# Patient Record
Sex: Male | Born: 1980 | Race: White | Hispanic: No | Marital: Married | Smoking: Never smoker
Health system: Southern US, Community
[De-identification: ages and names within clinical notes are randomized; demographics above are authoritative.]

## PROBLEM LIST (undated history)

## (undated) DIAGNOSIS — G43909 Migraine, unspecified, not intractable, without status migrainosus: Secondary | ICD-10-CM

## (undated) DIAGNOSIS — R5383 Other fatigue: Secondary | ICD-10-CM

## (undated) DIAGNOSIS — Z789 Other specified health status: Secondary | ICD-10-CM

## (undated) HISTORY — DX: Migraine, unspecified, not intractable, without status migrainosus: G43.909

## (undated) HISTORY — DX: Other fatigue: R53.83

## (undated) HISTORY — DX: Other specified health status: Z78.9

---

## 2016-12-07 ENCOUNTER — Encounter: Payer: Self-pay | Admitting: *Deleted

## 2016-12-08 ENCOUNTER — Other Ambulatory Visit
Admission: RE | Admit: 2016-12-08 | Discharge: 2016-12-08 | Disposition: A | Discharge status: Home / Self Care | Payer: BLUE CROSS/BLUE SHIELD | Source: Ambulatory Visit | Attending: General Surgery | Admitting: General Surgery

## 2016-12-08 ENCOUNTER — Encounter: Payer: Self-pay | Admitting: General Surgery

## 2016-12-08 ENCOUNTER — Ambulatory Visit (INDEPENDENT_AMBULATORY_CARE_PROVIDER_SITE_OTHER): Payer: BLUE CROSS/BLUE SHIELD | Admitting: General Surgery

## 2016-12-08 ENCOUNTER — Ambulatory Visit
Admission: RE | Admit: 2016-12-08 | Discharge: 2016-12-08 | Disposition: A | Discharge status: Home / Self Care | Payer: BLUE CROSS/BLUE SHIELD | Source: Ambulatory Visit | Attending: General Surgery | Admitting: General Surgery

## 2016-12-08 VITALS — BP 138/78 | HR 66 | Temp 97.9°F | Resp 12 | Ht 70.0 in | Wt 179.0 lb

## 2016-12-08 DIAGNOSIS — R1031 Right lower quadrant pain: Secondary | ICD-10-CM

## 2016-12-08 LAB — CBC WITH DIFFERENTIAL/PLATELET
BASOS ABS: 0.1 10*3/uL (ref 0–0.1)
Basophils Relative: 1 %
Eosinophils Absolute: 0 10*3/uL (ref 0–0.7)
Eosinophils Relative: 1 %
HEMATOCRIT: 46.7 % (ref 40.0–52.0)
Hemoglobin: 16 g/dL (ref 13.0–18.0)
LYMPHS PCT: 31 %
Lymphs Abs: 1.3 10*3/uL (ref 1.0–3.6)
MCH: 30.4 pg (ref 26.0–34.0)
MCHC: 34.4 g/dL (ref 32.0–36.0)
MCV: 88.3 fL (ref 80.0–100.0)
MONO ABS: 0.2 10*3/uL (ref 0.2–1.0)
Monocytes Relative: 6 %
NEUTROS ABS: 2.6 10*3/uL (ref 1.4–6.5)
Neutrophils Relative %: 61 %
Platelets: 233 10*3/uL (ref 150–440)
RBC: 5.28 MIL/uL (ref 4.40–5.90)
RDW: 13 % (ref 11.5–14.5)
WBC: 4.2 10*3/uL (ref 3.8–10.6)

## 2016-12-08 MED ORDER — IOPAMIDOL (ISOVUE-300) INJECTION 61%
100.0000 mL | Freq: Once | INTRAVENOUS | Status: AC | PRN
  Administered 2016-12-08: 100 mL via INTRAVENOUS

## 2016-12-08 NOTE — Patient Instructions (Addendum)
Repeat CT scan to see if anything has changed with ileum or appendix, and assess whether antibiotics are appropriate.  Return to office after CT completed.  No acute or abnormal findings on CT. CBC was normal. No further treatment or evaluation indicated at this time. Advised patient to call if symptoms worsen.

## 2016-12-08 NOTE — Progress Notes (Signed)
Patient ID: [name] [name], male   DOB: [birth year], 36 y.o.   MRN: [id number]  Chief Complaint  Patient presents with  . Abdominal Pain    HPI [name] [name] is a 36 y.o. male.  Here today for evaluation of lower right abdomen pain radiating to the center of abdomen. The pain started Friday, associated with nausea and headaches but no vomiting. He did go to the ED at [facility]. CT scan was done. He has never had this type of pain in the past. Reports normal bowel function before this started.  States he had fever but didn't check it.   HPI  Past Medical History:  Diagnosis Date  . Patient denies medical problems     History reviewed. No pertinent surgical history.  Family History  Problem Relation Age of Onset  . Breast cancer Mother     Social History Social History  Substance Use Topics  . Smoking status: Never Smoker  . Smokeless tobacco: Never Used  . Alcohol use Yes    No Known Allergies  Current Outpatient Prescriptions  Medication Sig Dispense Refill  . ondansetron (ZOFRAN-ODT) 4 MG disintegrating tablet Take 4 mg by mouth.     No current facility-administered medications for this visit.     Review of Systems Review of Systems  Constitutional: Positive for fever.  Respiratory: Negative.   Cardiovascular: Negative.   Gastrointestinal: Positive for abdominal pain and nausea. Negative for vomiting.  Neurological: Positive for headaches.    Blood pressure 138/78, pulse 66, temperature 97.9 F (36.6 C), temperature source Oral, resp. rate 12, height  (1.778 m), weight 179 lb (81.2 kg).  Physical Exam Physical Exam  Constitutional: He is oriented to person, place, and time. He appears well-developed and well-nourished.  Eyes: Conjunctivae are normal. No scleral icterus.  Neck: Neck supple.  Cardiovascular: Normal rate, regular rhythm and normal heart sounds.   Pulmonary/Chest: Effort normal and breath sounds normal.  Abdominal: Soft. Bowel sounds are normal.  There is tenderness (right lower quadrant, mild, no rebound).  Lymphadenopathy:    He has no cervical adenopathy.  Neurological: He is alert and oriented to person, place, and time.  Skin: Skin is warm and dry.  Psychiatric: His behavior is normal.    Data Reviewed CT from 3 days ago reviewed- mild mucosal edema of terminal ileum noted Repeat CT today is entirely normal. WBC is normal. Assessment    RLQ abd pain which is better today. No apparent cause.     Plan        No further treatment or evaluation indicated at this time. Advised patient to call if symptoms worsen or recur.      HPI, Physical Exam, Assessment and Plan have been scribed under the direction and in the presence of Kathreen Cosier, MD  Dorathy Daft, RN  I have completed the exam and reviewed the above documentation for accuracy and completeness.  I agree with the above.  Museum/gallery conservator has been used and any errors in dictation or transcription are unintentional.  Ajia Chadderdon G. Evette Cristal, M.D., F.A.C.S.  Gerlene Burdock G 12/09/2016, 9:29 AM

## 2017-06-02 ENCOUNTER — Emergency Department
Admission: EM | Admit: 2017-06-02 | Discharge: 2017-06-02 | Disposition: A | Discharge status: Home / Self Care | Payer: BLUE CROSS/BLUE SHIELD | Attending: Emergency Medicine | Admitting: Emergency Medicine

## 2017-06-02 ENCOUNTER — Encounter: Payer: Self-pay | Admitting: Emergency Medicine

## 2017-06-02 ENCOUNTER — Emergency Department: Payer: BLUE CROSS/BLUE SHIELD

## 2017-06-02 DIAGNOSIS — Y929 Unspecified place or not applicable: Secondary | ICD-10-CM | POA: Insufficient documentation

## 2017-06-02 DIAGNOSIS — Y990 Civilian activity done for income or pay: Secondary | ICD-10-CM | POA: Diagnosis not present

## 2017-06-02 DIAGNOSIS — W319XXA Contact with unspecified machinery, initial encounter: Secondary | ICD-10-CM | POA: Diagnosis not present

## 2017-06-02 DIAGNOSIS — Y939 Activity, unspecified: Secondary | ICD-10-CM | POA: Insufficient documentation

## 2017-06-02 DIAGNOSIS — S61412A Laceration without foreign body of left hand, initial encounter: Secondary | ICD-10-CM | POA: Diagnosis not present

## 2017-06-02 DIAGNOSIS — S6992XA Unspecified injury of left wrist, hand and finger(s), initial encounter: Secondary | ICD-10-CM | POA: Diagnosis present

## 2017-06-02 MED ORDER — MELOXICAM 15 MG PO TABS: 15.0000 mg | ORAL_TABLET | Freq: Every day | ORAL | 1 refills | Status: AC

## 2017-06-02 MED ORDER — SULFAMETHOXAZOLE-TRIMETHOPRIM 800-160 MG PO TABS: 1.0000 | ORAL_TABLET | Freq: Two times a day (BID) | ORAL | 0 refills | Status: AC

## 2017-06-02 MED ORDER — OXYCODONE-ACETAMINOPHEN 5-325 MG PO TABS: 1.0000 | ORAL_TABLET | Freq: Four times a day (QID) | ORAL | 0 refills | Status: AC | PRN

## 2017-06-02 MED ORDER — OXYCODONE-ACETAMINOPHEN 5-325 MG PO TABS
1.0000 | ORAL_TABLET | Freq: Once | ORAL | Status: AC
  Administered 2017-06-02: 1 via ORAL
  Filled 2017-06-02: qty 1

## 2017-06-02 MED ORDER — LIDOCAINE HCL (PF) 1 % IJ SOLN
INTRAMUSCULAR | Status: AC
Start: 2017-06-02 — End: 2017-06-02
  Administered 2017-06-02: 5 mL
  Filled 2017-06-02: qty 10

## 2017-06-02 MED ORDER — LIDOCAINE HCL 1 % IJ SOLN
5.0000 mL | Freq: Once | INTRAMUSCULAR | Status: AC
  Administered 2017-06-02: 5 mL
  Filled 2017-06-02: qty 5

## 2017-06-02 NOTE — ED Provider Notes (Signed)
[facility] Emergency Department Provider Note  ____________________________________________  Time seen: Approximately 4:55 PM  I have reviewed the triage vital signs and the nursing notes.   HISTORY  Chief Complaint Laceration    HPI [name] [name] is a 36 y.o. male presents to the emergency department with a 3 cm in length by 2 cm in width laceration sustained between the second and third digits of the left hand with a machine at work.  Patient reports no radiculopathy or changes in sensation of the left hand.  Patient has been able to actively flex and extend the second and third digits.  Tetanus status is up-to-date.  No alleviating measures have been attempted.   Past Medical History:  Diagnosis Date  . Patient denies medical problems     There are no active problems to display for this patient.   History reviewed. No pertinent surgical history.  Prior to Admission medications   Medication Sig Start Date End Date Taking? Authorizing Provider  meloxicam (MOBIC) 15 MG tablet Take 1 tablet (15 mg total) by mouth daily. 06/02/17 06/16/17  Orvil Feil, PA-C  oxyCODONE-acetaminophen (ROXICET) 5-325 MG tablet Take 1 tablet by mouth every 6 (six) hours as needed for severe pain. 06/02/17 06/07/17  Orvil Feil, PA-C  sulfamethoxazole-trimethoprim (BACTRIM DS,SEPTRA DS) 800-160 MG tablet Take 1 tablet by mouth 2 (two) times daily. 06/02/17 06/09/17  Orvil Feil, PA-C    Allergies Patient has no known allergies.  Family History  Problem Relation Age of Onset  . Breast cancer Mother     Social History Social History  Substance Use Topics  . Smoking status: Never Smoker  . Smokeless tobacco: Never Used  . Alcohol use Yes     Review of Systems  Constitutional: No fever/chills Eyes: No visual changes. No discharge ENT: No upper respiratory complaints. Cardiovascular: no chest pain. Respiratory: no cough. No SOB. Musculoskeletal:  Negative for musculoskeletal pain. Skin: Patient has a laceration Neurological: Negative for headaches, focal weakness or numbness.  ____________________________________________   PHYSICAL EXAM:  VITAL SIGNS: ED Triage Vitals [06/02/17 1645]  Enc Vitals Group     BP 130/74     Pulse Rate 73     Resp 18     Temp      Temp Source Oral     SpO2 100 %     Weight 180 lb (81.6 kg)     Height 5\' 10"  (1.778 m)     Head Circumference      Peak Flow      Pain Score 7     Pain Loc      Pain Edu?      Excl. in GC?      Constitutional: Alert and oriented. Well appearing and in no acute distress. Eyes: Conjunctivae are normal. PERRL. EOMI. Head: Atraumatic. Cardiovascular: Normal rate, regular rhythm. Normal S1 and S2.  Good peripheral circulation. Respiratory: Normal respiratory effort without tachypnea or retractions. Lungs CTAB. Good air entry to the bases with no decreased or absent breath sounds. Musculoskeletal: Patient is able to perform resisted flexion and extension at the second and third digits.  Palpable radial pulse, right. Neurologic:  Normal speech and language. No gross focal neurologic deficits are appreciated.  Skin: Has a 3 cm in length by 2 cm in width laceration sustained between the second and third digits at the dorsal aspect of the left hand. Psychiatric: Mood and affect are normal. Speech and behavior are normal. Patient exhibits appropriate  insight and judgement.   ____________________________________________   LABS (all labs ordered are listed, but only abnormal results are displayed)  Labs Reviewed - No data to display ____________________________________________  EKG   ____________________________________________  RADIOLOGY Geraldo Pitter, personally viewed and evaluated these images (plain radiographs) as part of my medical decision making, as well as reviewing the written report by the radiologist.    Dg Hand Complete Left  Result Date:  06/02/2017 CLINICAL DATA:  Left hand laceration EXAM: LEFT HAND - COMPLETE 3+ VIEW COMPARISON:  None. FINDINGS: Soft tissue laceration between the second and third digits. Slightly comminuted cortical fracture involving the base of the second proximal phalanx with articular involvement. Multiple indistinct bone fragments within the soft tissue web between the second and third digits. IMPRESSION: Soft tissue laceration between the second and third digits with evidence for comminuted fracture at the base of the second proximal phalanx with multiple displaced bone fragments. Electronically Signed   By: Jasmine Pang M.D.   On: 06/02/2017 17:31    ____________________________________________    PROCEDURES  Procedure(s) performed:    Procedures  LACERATION REPAIR Performed by: Orvil Feil Authorized by: Orvil Feil Consent: Verbal consent obtained. Risks and benefits: risks, benefits and alternatives were discussed Consent given by: patient Patient identity confirmed: provided demographic data Prepped and Draped in normal sterile fashion Wound explored  Laceration Location: Left hand   Laceration Length: 3 cm in length by 2 cm in width.   No Foreign Bodies seen or palpated  Anesthesia: local infiltration  Local anesthetic: lidocaine 1% without epinephrine  Anesthetic total: 10 ml  Irrigation method: syringe Amount of cleaning: standard  Skin closure: 4-0 Ethilon   Number of sutures: 10  Technique: Simple Interrupted.   Patient tolerance: Patient tolerated the procedure well with no immediate complications.   Medications  oxyCODONE-acetaminophen (PERCOCET/ROXICET) 5-325 MG per tablet 1 tablet (1 tablet Oral Given 06/02/17 1708)  lidocaine (XYLOCAINE) 1 % (with pres) injection 5 mL (5 mLs Infiltration Given by Other 06/02/17 1837)  lidocaine (XYLOCAINE) 1 % (with pres) injection 5 mL (5 mLs Infiltration Given by Other 06/02/17 1836)      ____________________________________________   INITIAL IMPRESSION / ASSESSMENT AND PLAN / ED COURSE  Pertinent labs & imaging results that were available during my care of the patient were reviewed by me and considered in my medical decision making (see chart for details).  Review of the [place] CSRS was performed in accordance of the [facility] prior to dispensing any controlled drugs.    Assessment and plan Left hand laceration Patient presents to the emergency department with a 3 cm in length by 2 cm in width left hand laceration.  Differential diagnosis includes tensor tendon disruption versus extensor tendon laceration versus superficial laceration versus fracture.  X-ray examination conducted in the emergency department was concerning for comminuted fracture at the base of the second phalanx of the left hand.  Patient underwent laceration repair in the emergency department without complication.  There was no deficits in extensor tendon testing, decreasing suspicion for extensor tendon disruption or laceration.  It was buddy taped and splinted after laceration repair.  He was advised to seek care and follow-up with Dr. Stephenie Acres.  Due to complicated nature of laceration and concern for open fracture, patient was discharged with Bactrim for MRSA coverage.  Patient was advised to have sutures removed by primary care in 10 days.  Work note was given.    ____________________________________________  FINAL CLINICAL IMPRESSION(S) /  ED DIAGNOSES  Final diagnoses:  Laceration of left hand without foreign body, initial encounter      NEW MEDICATIONS STARTED DURING THIS VISIT:  New Prescriptions   MELOXICAM (MOBIC) 15 MG TABLET    Take 1 tablet (15 mg total) by mouth daily.   OXYCODONE-ACETAMINOPHEN (ROXICET) 5-325 MG TABLET    Take 1 tablet by mouth every 6 (six) hours as needed for severe pain.   SULFAMETHOXAZOLE-TRIMETHOPRIM (BACTRIM DS,SEPTRA DS) 800-160 MG TABLET    Take 1 tablet by mouth 2  (two) times daily.        This chart was dictated using voice recognition software/Dragon. Despite best efforts to proofread, errors can occur which can change the meaning. Any change was purely unintentional.    Orvil FeilWoods, Taitum Alms M, PA-C 06/02/17 1843    Phineas SemenGoodman, Graydon, MD 06/02/17 [phone]

## 2017-06-02 NOTE — ED Notes (Signed)
Per pt immediate supervisor pt is not required to complete WC drug screening or breath analysis Diona FoleyBrandon Griggs (surpervisor)

## 2017-06-02 NOTE — ED Notes (Signed)
First Nurse: pt presents with left hand injury with grinding machine. PTA, WC.

## 2017-06-02 NOTE — ED Triage Notes (Signed)
Pt comes into the ED via POv c/o workers comp laceration to the left hand.  Bleeding controlled at this time.  Patient ambulatory to triage with no difficulties noted. Patient states he is up to date on his tetanus.

## 2017-06-06 DIAGNOSIS — S62609A Fracture of unspecified phalanx of unspecified finger, initial encounter for closed fracture: Secondary | ICD-10-CM | POA: Insufficient documentation

## 2017-06-07 ENCOUNTER — Ambulatory Visit
Admission: RE | Admit: 2017-06-07 | Discharge: 2017-06-07 | Disposition: A | Discharge status: Home / Self Care | Payer: BLUE CROSS/BLUE SHIELD | Source: Ambulatory Visit | Attending: Family Medicine | Admitting: Family Medicine

## 2017-06-07 ENCOUNTER — Other Ambulatory Visit: Payer: Self-pay | Admitting: Family Medicine

## 2017-06-07 DIAGNOSIS — R05 Cough: Secondary | ICD-10-CM | POA: Diagnosis present

## 2017-06-07 DIAGNOSIS — R918 Other nonspecific abnormal finding of lung field: Secondary | ICD-10-CM | POA: Insufficient documentation

## 2017-06-07 DIAGNOSIS — R059 Cough, unspecified: Secondary | ICD-10-CM

## 2017-06-29 ENCOUNTER — Encounter: Payer: BLUE CROSS/BLUE SHIELD | Attending: Physician Assistant | Admitting: Physician Assistant

## 2017-06-29 DIAGNOSIS — L98496 Non-pressure chronic ulcer of skin of other sites with bone involvement without evidence of necrosis: Secondary | ICD-10-CM | POA: Insufficient documentation

## 2017-06-29 DIAGNOSIS — W319XXA Contact with unspecified machinery, initial encounter: Secondary | ICD-10-CM | POA: Insufficient documentation

## 2017-06-29 DIAGNOSIS — S61412A Laceration without foreign body of left hand, initial encounter: Secondary | ICD-10-CM | POA: Diagnosis not present

## 2017-06-30 NOTE — Progress Notes (Signed)
[name] [name], Ashwin ([id number]) Visit Report for 06/29/2017 Abuse/Suicide Risk Screen Details Patient Name: [name] [name], Zymarion Date of Service: 06/29/2017 1:30 PM Medical Record Number: [id number] Patient Account Number: [id number] Date of Birth/Sex: 12/06/1980 (36 y.o. Male) Treating RN: Phillis HaggisPinkerton, Debi Primary Care Mattew Chriswell: Tarri AbernethyABINOWITZ, JOSEPH Other Clinician: Referring Joanne Salah: Referral, Self Treating Dot Splinter/Extender: STONE III, HOYT Weeks in Treatment: 0 Abuse/Suicide Risk Screen Items Answer ABUSE/SUICIDE RISK SCREEN: Has anyone close to you tried to hurt or harm you recentlyo No Do you feel uncomfortable with anyone in your familyo No Has anyone forced you do things that you didnot want to doo No Do you have any thoughts of harming yourselfo No Patient displays signs or symptoms of abuse and/or neglect. No Electronic Signature(s) Signed: 06/29/2017 2:49:42 PM By: Alejandro MullingPinkerton, Debra Entered By: Alejandro MullingPinkerton, Debra on 06/29/2017 13:58:25 [name] [name], Taijuan ([id number]) -------------------------------------------------------------------------------- Activities of Daily Living Details Patient Name: [name] [name], Devonta Date of Service: 06/29/2017 1:30 PM Medical Record Number: [id number] Patient Account Number: [id number] Date of Birth/Sex: 06/09/1981 [age] y.o. Male) Treating RN: Phillis HaggisPinkerton, Debi Primary Care Cynthia Stainback: Tarri AbernethyABINOWITZ, JOSEPH Other Clinician: Referring Allin Frix: Referral, Self Treating Yida Hyams/Extender: STONE III, HOYT Weeks in Treatment: 0 Activities of Daily Living Items Answer Activities of Daily Living (Please select one for each item) Drive Automobile Completely Able Take Medications Completely Able Use Telephone Completely Able Care for Appearance Completely Able Use Toilet Completely Able Bath / Shower Completely Able Dress Self Completely Able Feed Self Completely Able Walk Completely Able Get In / Out Bed Completely Able Housework Completely Able Prepare Meals  Completely Able Handle Money Completely Able Shop for Self Completely Able Electronic Signature(s) Signed: 06/29/2017 2:49:42 PM By: Alejandro MullingPinkerton, Debra Entered By: Alejandro MullingPinkerton, Debra on 06/29/2017 13:58:43 [name] [name], Vanderbilt ([id number]) -------------------------------------------------------------------------------- Education Assessment Details Patient Name: [name] [name], Ramir Date of Service: 06/29/2017 1:30 PM Medical Record Number: [id number] Patient Account Number: [id number] Date of Birth/Sex: 05/17/1981 [age] y.o. Male) Treating RN: Phillis HaggisPinkerton, Debi Primary Care Aniyla Harling: Tarri AbernethyABINOWITZ, JOSEPH Other Clinician: Referring Caleyah Jr: Referral, Self Treating Jamariyah Johannsen/Extender: Linwood DibblesSTONE III, HOYT Weeks in Treatment: 0 Primary Learner Assessed: Patient Learning Preferences/Education Level/Primary Language Learning Preference: Explanation Highest Education Level: College or Above Preferred Language: English Cognitive Barrier Assessment/Beliefs Language Barrier: No Translator Needed: No Memory Deficit: No Emotional Barrier: No Cultural/Religious Beliefs Affecting Medical Care: No Physical Barrier Assessment Impaired Vision: No Impaired Hearing: No Decreased Hand dexterity: No Knowledge/Comprehension Assessment Knowledge Level: Medium Comprehension Level: Medium Ability to understand written Medium instructions: Ability to understand verbal Medium instructions: Motivation Assessment Anxiety Level: Calm Cooperation: Cooperative Education Importance: Acknowledges Need Interest in Health Problems: Asks Questions Perception: Coherent Willingness to Engage in Self- Medium Management Activities: Readiness to Engage in Self- Medium Management Activities: Electronic Signature(s) Signed: 06/29/2017 2:49:42 PM By: Alejandro MullingPinkerton, Debra Entered By: Alejandro MullingPinkerton, Debra on 06/29/2017 13:59:00 [name] [name], Jalan ([id number]) -------------------------------------------------------------------------------- Fall  Risk Assessment Details Patient Name: [name] [name], Moiz Date of Service: 06/29/2017 1:30 PM Medical Record Number: [id number] Patient Account Number: [id number] Date of Birth/Sex: 08/19/1980 [age] y.o. Male) Treating RN: Phillis HaggisPinkerton, Debi Primary Care Maveryk Renstrom: Tarri AbernethyABINOWITZ, JOSEPH Other Clinician: Referring Lieutenant Abarca: Referral, Self Treating Rick Warnick/Extender: Linwood DibblesSTONE III, HOYT Weeks in Treatment: 0 Fall Risk Assessment Items Have you had 2 or more falls in the last 12 monthso 0 No Have you had any fall that resulted in injury in the last 12 monthso 0 No FALL RISK ASSESSMENT: History of falling - immediate or within 3 months 0 No Secondary diagnosis 0 No Ambulatory aid None/bed rest/wheelchair/nurse 0 No Crutches/cane/walker 0 No Furniture 0 No IV Access/Saline Lock 0 No Gait/Training Normal/bed  rest/immobile 0 No Weak 0 No Impaired 0 No Mental Status Oriented to own ability 0 Yes Electronic Signature(s) Signed: 06/29/2017 2:49:42 PM By: Alejandro MullingPinkerton, Debra Entered By: Alejandro MullingPinkerton, Debra on 06/29/2017 13:59:10 [name] [name], Alphus ([id number]) -------------------------------------------------------------------------------- Foot Assessment Details Patient Name: [name] [name], Coley Date of Service: 06/29/2017 1:30 PM Medical Record Number: [id number] Patient Account Number: [id number] Date of Birth/Sex: 09/30/1980 12(36 y.o. Male) Treating RN: Phillis HaggisPinkerton, Debi Primary Care Devon Kingdon: Tarri AbernethyABINOWITZ, JOSEPH Other Clinician: Referring Kerigan Narvaez: Referral, Self Treating Payson Evrard/Extender: STONE III, HOYT Weeks in Treatment: 0 Foot Assessment Items Site Locations + = Sensation present, - = Sensation absent, C = Callus, U = Ulcer R = Redness, W = Warmth, M = Maceration, PU = Pre-ulcerative lesion F = Fissure, S = Swelling, D = Dryness Assessment Right: Left: Other Deformity: No No Prior Foot Ulcer: No No Prior Amputation: No No Charcot Joint: No No Ambulatory Status: Gait: Electronic Signature(s) Signed:  06/29/2017 2:49:42 PM By: Alejandro MullingPinkerton, Debra Entered By: Alejandro MullingPinkerton, Debra on 06/29/2017 13:59:40 [name] [name], Ellijah ([id number]) -------------------------------------------------------------------------------- Nutrition Risk Assessment Details Patient Name: [name] [name], Mercedes Date of Service: 06/29/2017 1:30 PM Medical Record Number: [id number] Patient Account Number: [id number] Date of Birth/Sex: 10/24/1980 [age] y.o. Male) Treating RN: Phillis HaggisPinkerton, Debi Primary Care Claretta Kendra: Tarri AbernethyABINOWITZ, JOSEPH Other Clinician: Referring Zolton Dowson: Referral, Self Treating Josephina Melcher/Extender: STONE III, HOYT Weeks in Treatment: 0 Height (in): 69 Weight (lbs): 190.3 Body Mass Index (BMI): 28.1 Nutrition Risk Assessment Items NUTRITION RISK SCREEN: I have an illness or condition that made me change the kind and/or amount of 0 No food I eat I eat fewer than two meals per day 0 No I eat few fruits and vegetables, or milk products 0 No I have three or more drinks of beer, liquor or wine almost every day 0 No I have tooth or mouth problems that make it hard for me to eat 0 No I don't always have enough money to buy the food I need 0 No I eat alone most of the time 0 No I take three or more different prescribed or over-the-counter drugs a day 0 No Without wanting to, I have lost or gained 10 pounds in the last six months 0 No I am not always physically able to shop, cook and/or feed myself 0 No Nutrition Protocols Good Risk Protocol 0 No interventions needed Moderate Risk Protocol Electronic Signature(s) Signed: 06/29/2017 2:49:42 PM By: Alejandro MullingPinkerton, Debra Entered By: Alejandro MullingPinkerton, Debra on 06/29/2017 13:59:34

## 2017-06-30 NOTE — Progress Notes (Signed)
ZAYLAN, KISSOON ([id number]) Visit Report for 06/29/2017 Chief Complaint Document Details Patient Name: [name] [name], [name] [name] Date of Service: 06/29/2017 1:30 PM Medical Record Number: [id number] Patient Account Number: [id number] Date of Birth/Sex: 1980/11/02 (36 y.o. Male) Treating RN: Phillis Haggis Primary Care Provider: Tarri Abernethy Other Clinician: Referring Provider: Referral, Self Treating Provider/Extender: Linwood Dibbles, HOYT Weeks in Treatment: 0 Information Obtained from: Patient Chief Complaint Left hand non-healing laceration which occurred on 06/02/17 Electronic Signature(s) Signed: 06/29/2017 4:01:13 PM By: Lenda Kelp PA-C Entered By: Lenda Kelp on 06/29/2017 15:27:44 [name] [name] ([id number]) -------------------------------------------------------------------------------- Debridement Details Patient Name: [name] [name] Date of Service: 06/29/2017 1:30 PM Medical Record Number: [id number] Patient Account Number: [id number] Date of Birth/Sex: 27-Oct-1980 (36 y.o. Male) Treating RN: Phillis Haggis Primary Care Provider: Tarri Abernethy Other Clinician: Referring Provider: Referral, Self Treating Provider/Extender: Linwood Dibbles, HOYT Weeks in Treatment: 0 Debridement Performed for Wound #1 Left Hand - Web Between 1st and 2nd Digit Assessment: Performed By: Physician STONE III, HOYT E., PA-C Debridement: Debridement Pre-procedure Verification/Time Yes - 14:12 Out Taken: Start Time: 14:13 Pain Control: Lidocaine 4% Topical Solution Level: Skin/Subcutaneous Tissue Total Area Debrided (L x W): 2 (cm) x 0.3 (cm) = 0.6 (cm) Tissue and other material Viable, Non-Viable, Exudate, Fibrin/Slough, Subcutaneous debrided: Instrument: Curette Bleeding: Minimum Hemostasis Achieved: Pressure End Time: 14:16 Procedural Pain: 0 Post Procedural Pain: 0 Response to Treatment: Procedure was tolerated well Post Debridement Measurements of Total Wound Length: (cm)  2 Width: (cm) 0.4 Depth: (cm) 0.4 Volume: (cm) 0.251 Character of Wound/Ulcer Post Debridement: Requires Further Debridement Post Procedure Diagnosis Same as Pre-procedure Electronic Signature(s) Signed: 06/29/2017 2:49:42 PM By: Alejandro Mulling Signed: 06/29/2017 4:01:13 PM By: Lenda Kelp PA-C Entered By: Alejandro Mulling on 06/29/2017 14:18:26 [name] [name] ([id number]) -------------------------------------------------------------------------------- HPI Details Patient Name: [name] [name] Date of Service: 06/29/2017 1:30 PM Medical Record Number: [id number] Patient Account Number: [id number] Date of Birth/Sex: 11/04/1980 (36 y.o. Male) Treating RN: Phillis Haggis Primary Care Provider: Tarri Abernethy Other Clinician: Referring Provider: Referral, Self Treating Provider/Extender: Linwood Dibbles, HOYT Weeks in Treatment: 0 History of Present Illness Associated Signs and Symptoms: Patient has no major medical problems HPI Description: 06/29/17 on evaluation today patient appears to be doing fairly well other than the fact that he has noted on this initial evaluation and laceration of the left hand in the dorsal web spaces between the second and third fingers. He showed me the initial picture and this was a machine shop tool which calls the laceration and it actually cut down and involve the bone itself. He really has no pain he tells me other than some type feelings other than when the wound is being cleansed such as what I was doing today. No fevers, chills, nausea, or vomiting noted at this time. He does tell me that due to the swelling and tightness he doesn't have full range of motion at this point as he's afraid of extending and worsening the wound with too much motion. He has been using Steri-Strips to help secure this somewhat. He also works as a IT sales professional. Fortunately he has no other major medical problems. Specifically no diabetes. Electronic Signature(s) Signed:  06/29/2017 4:01:13 PM By: Lenda Kelp PA-C Entered By: Lenda Kelp on 06/29/2017 15:30:47 [name] [name] ([id number]) -------------------------------------------------------------------------------- Physical Exam Details Patient Name: [name] [name] Date of Service: 06/29/2017 1:30 PM Medical Record Number: [id number] Patient Account Number: [id number] Date of Birth/Sex: 1980-12-28 (36 y.o. Male) Treating RN: Phillis Haggis Primary Care Provider: Tarri Abernethy Other Clinician: Referring Provider:  Referral, Self Treating Provider/Extender: STONE III, HOYT Weeks in Treatment: 0 Constitutional sitting or standing blood pressure is within target range for patient.. pulse regular and within target range for patient.Marland Kitchen. respirations regular, non-labored and within target range for patient.Marland Kitchen. temperature within target range for patient.. Well- nourished and well-hydrated in no acute distress. Eyes conjunctiva clear no eyelid edema noted. pupils equal round and reactive to light and accommodation. Ears, Nose, Mouth, and Throat no gross abnormality of ear auricles or external auditory canals. normal hearing noted during conversation. mucus membranes moist. Respiratory normal breathing without difficulty. clear to auscultation bilaterally. Cardiovascular regular rate and rhythm with normal S1, S2. no clubbing, cyanosis, significant edema, <3 sec cap refill. Gastrointestinal (GI) soft, non-tender, non-distended, +BS. no ventral hernia noted. Musculoskeletal normal gait and posture. no significant deformity or arthritic changes, no loss or range of motion, no clubbing. Psychiatric this patient is able to make decisions and demonstrates good insight into disease process. Alert and Oriented x 3. pleasant and cooperative. Notes Patient has a laceration noted between the well-being of the left second and third fingers which is slough covered and does has an area at the proximal and  distal in where there is a deeper portion. The central seems to have adhered more tightly and securely. Electronic Signature(s) Signed: 06/29/2017 4:01:13 PM By: Lenda KelpStone III, Hoyt PA-C Entered By: Lenda KelpStone III, Hoyt on 06/29/2017 15:34:09 [name] [name], [name] [name] ([id number]) -------------------------------------------------------------------------------- Physician Orders Details Patient Name: [name] [name], [name] [name] Date of Service: 06/29/2017 1:30 PM Medical Record Number: [id number] Patient Account Number: [id number] Date of Birth/Sex: 11/24/1980 (36 y.o. Male) Treating RN: Phillis HaggisPinkerton, Debi Primary Care Provider: Tarri AbernethyABINOWITZ, JOSEPH Other Clinician: Referring Provider: Referral, Self Treating Provider/Extender: Linwood DibblesSTONE III, HOYT Weeks in Treatment: 0 Verbal / Phone Orders: Yes Clinician: Pinkerton, Debi Read Back and Verified: Yes Diagnosis Coding Wound Cleansing Wound #1 Left Hand - Web Between 1st and 2nd Digit o Clean wound with Normal Saline. o Cleanse wound with mild soap and water o May Shower, gently pat wound dry prior to applying new dressing. Anesthetic Wound #1 Left Hand - Web Between 1st and 2nd Digit o Topical Lidocaine 4% cream applied to wound bed prior to debridement Primary Wound Dressing Wound #1 Left Hand - Web Between 1st and 2nd Digit o Silvercel Non-Adherent Secondary Dressing Wound #1 Left Hand - Web Between 1st and 2nd Digit o Other - coverlet Dressing Change Frequency Wound #1 Left Hand - Web Between 1st and 2nd Digit o Change dressing every day. Follow-up Appointments Wound #1 Left Hand - Web Between 1st and 2nd Digit o Return Appointment in 1 week. Additional Orders / Instructions Wound #1 Left Hand - Web Between 1st and 2nd Digit o Increase protein intake. Electronic Signature(s) Signed: 06/29/2017 2:49:42 PM By: Alejandro MullingPinkerton, Debra Signed: 06/29/2017 4:01:13 PM By: Lenda KelpStone III, Hoyt PA-C Entered By: Alejandro MullingPinkerton, Debra on 06/29/2017 14:19:24 [name] [name], Keinan  ([id number]) -------------------------------------------------------------------------------- Problem List Details Patient Name: [name] [name], [name] [name] Date of Service: 06/29/2017 1:30 PM Medical Record Number: [id number] Patient Account Number: [id number] Date of Birth/Sex: 04/04/1981 (36 y.o. 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 Male) Treating RN: Phillis Haggis Primary Care Provider: Tarri Abernethy Other Clinician: Referring Provider: Referral, Self Treating Provider/Extender: Linwood Dibbles, HOYT Weeks in Treatment: 0 Subjective Chief Complaint Information obtained from Patient Left hand non-healing laceration which occurred on 06/02/17 History of Present Illness (HPI) The following HPI elements were documented for the patient's wound: Associated Signs and Symptoms: Patient has no major medical problems 06/29/17 on evaluation today patient appears to be doing fairly well other than the fact that he has noted on this initial evaluation and laceration of the left hand in the dorsal web  spaces between the second and third fingers. He showed me the initial picture and this was a machine shop tool which calls the laceration and it actually cut down and involve the bone itself. He really has no pain he tells me other than some type feelings other than when the wound is being cleansed such as what I was doing today. No fevers, chills, nausea, or vomiting noted at this time. He does tell me that due to the swelling and tightness he doesn't have full range of motion at this point as he's afraid of extending and worsening the wound with too much motion. He has been using Steri-Strips to help secure this somewhat. He also works as a IT sales professional. Fortunately he has no other major medical problems. Specifically no diabetes. Wound History Patient presents with 1 open wound that has been present for approximately 10/25. Patient has been treating wound in the following manner: neosporin and steri-strips. Laboratory tests have not been performed in the last month. Patient reportedly has not tested positive for an antibiotic resistant organism. Patient reportedly has not tested positive for osteomyelitis. Patient reportedly has not had testing performed to evaluate circulation in the legs. Patient experiences the following problems associated with their wounds: swelling. Patient History Information obtained from Patient. Allergies NKDA Family History Cancer - Mother,Paternal Grandparents, Heart Disease - Maternal Grandparents, Hypertension - Mother,Father, No family history of Diabetes, Hereditary Spherocytosis, Kidney Disease, Lung Disease, Seizures, Stroke, Thyroid Problems, Tuberculosis. Social History Never smoker, Marital Status - Married, Alcohol Use - Rarely, Drug Use - No History, Caffeine Use - Daily. Review of Systems (ROS) Constitutional Symptoms (General Health) The patient has no complaints or symptoms. Eyes The patient has no complaints or  symptoms. Ear/Nose/Mouth/Throat [name] [name], [name] [name] ([id number]) The patient has no complaints or symptoms. Hematologic/Lymphatic The patient has no complaints or symptoms. Respiratory The patient has no complaints or symptoms. Cardiovascular The patient has no complaints or symptoms. Gastrointestinal The patient has no complaints or symptoms. Endocrine The patient has no complaints or symptoms. Genitourinary The patient has no complaints or symptoms. Immunological The patient has no complaints or symptoms. Integumentary (Skin) The patient has no complaints or symptoms. Musculoskeletal The patient has no complaints or symptoms. Neurologic The patient has no complaints or symptoms. Oncologic The patient has no complaints or symptoms. Psychiatric The patient has no complaints or symptoms. Objective Constitutional sitting or standing blood pressure is within target range for patient.. pulse regular and within target range for patient.Marland Kitchen respirations regular, non-labored and within target range for patient.Marland Kitchen temperature within target range for patient.. Well- nourished and well-hydrated in no acute distress. Vitals Time Taken: 1:51 PM, Height: 69 in, Source: Stated, Weight: 190.3 lbs, Source: Stated,  BMI: 28.1, Temperature: 98.3  F, Pulse: 71 bpm, Respiratory Rate: 20 breaths/min, Blood Pressure: 136/82 mmHg. Eyes conjunctiva clear no eyelid edema noted. pupils equal round and reactive to light and accommodation. Ears, Nose, Mouth, and Throat no gross abnormality of ear auricles or external auditory canals. normal hearing noted during conversation. mucus membranes moist. Respiratory normal breathing without difficulty. clear to auscultation bilaterally. Cardiovascular regular rate and rhythm with normal S1, S2. no clubbing, cyanosis, significant edema, Gastrointestinal (GI) soft, non-tender, non-distended, +BS. no ventral hernia noted. [name] [name], [name] [name]  ([id number]) Musculoskeletal normal gait and posture. no significant deformity or arthritic changes, no loss or range of motion, no clubbing. Psychiatric this patient is able to make decisions and demonstrates good insight into disease process. Alert and Oriented x 3. pleasant and cooperative. General Notes: Patient has a laceration noted between the well-being of the left second and third fingers which is slough covered and does has an area at the proximal and distal in where there is a deeper portion. The central seems to have adhered more tightly and securely. Integumentary (Hair, Skin) Wound #1 status is Open. Original cause of wound was Trauma. The wound is located on the Left Hand - Web Between 1st and 2nd Digit. The wound measures 2cm length x 0.3cm width x 0.3cm depth; 0.471cm^2 area and 0.141cm^3 volume. There is no tunneling noted. There is a large amount of serous drainage noted. The wound margin is distinct with the outline attached to the wound base. There is no granulation within the wound bed. There is a large (67-100%) amount of necrotic tissue within the wound bed including Eschar and Adherent Slough. The periwound skin appearance exhibited: Scarring, Maceration. Periwound temperature was noted as No Abnormality. The periwound has tenderness on palpation. 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 There was a Skin/Subcutaneous Tissue Debridement ([id number]) debridement with total area of 0.6 sq cm performed by STONE III, HOYT E., PA-C. with the following instrument(s): Curette to remove Viable and Non-Viable tissue/material including Exudate, Fibrin/Slough, and  Subcutaneous after achieving pain control using Lidocaine 4% Topical Solution. A time out was conducted at 14:12, prior to the start of the procedure. A Minimum amount of bleeding was controlled with Pressure. The procedure was tolerated well with a pain level of 0 throughout and a pain level of 0 following the procedure. Post Debridement Measurements: 2cm length x 0.4cm width x 0.4cm depth; 0.251cm^3 volume. Character of Wound/Ulcer Post Debridement requires further debridement. Post procedure Diagnosis Wound #1: Same as Pre-Procedure Plan Wound Cleansing: [name] [name], [name] [name] ([id number]) Wound #1 Left Hand - Web Between 1st and 2nd Digit: Clean wound with Normal Saline. Cleanse wound with mild soap and water May Shower, gently pat wound dry prior to applying new dressing. Anesthetic: Wound #1 Left Hand - Web Between 1st and 2nd Digit: Topical Lidocaine 4% cream applied to wound bed prior to debridement Primary Wound Dressing: Wound #1 Left Hand - Web Between 1st and 2nd Digit: Silvercel Non-Adherent Secondary Dressing: Wound #1 Left Hand - Web Between 1st and 2nd Digit: Other - coverlet Dressing Change Frequency: Wound #1 Left Hand - Web Between 1st and 2nd Digit: Change dressing every day. Follow-up Appointments: Wound #1 Left Hand - Web  Between 1st and 2nd Digit: Return Appointment in 1 week. Additional Orders / Instructions: Wound #1 Left Hand - Web Between 1st and 2nd Digit: Increase protein intake. At this point I'm gonna recommend that we initiate treatment with a silver alginate dressing and this should be packed into the proximal opening of the wound especially where there is more depth. There was actually bone palpable on probing at the site and hopefully we can get this to granulate over very quickly. Fortunately patient is in very good health. Please see above for specific wound care orders. We will see patient for re-evaluation in 1 week here in the clinic. If anything  worsens or changes patient will contact our office for additional recommendations. Electronic Signature(s) Signed: 06/29/2017 4:01:13 PM By: Lenda KelpStone III, Hoyt PA-C Entered By: Lenda KelpStone III, Hoyt on 06/29/2017 15:35:30 [name] [name], Theordore ([id number]) -------------------------------------------------------------------------------- ROS/PFSH Details Patient Name: [name] [name], Amarii Date of Service: 06/29/2017 1:30 PM Medical Record Number: [id number] Patient Account Number: [id number] Date of Birth/Sex: 02/19/1981 (36 y.o. Male) Treating RN: Phillis HaggisPinkerton, Debi Primary Care Provider: Tarri AbernethyABINOWITZ, JOSEPH Other Clinician: Referring Provider: Referral, Self Treating Provider/Extender: Linwood DibblesSTONE III, HOYT Weeks in Treatment: 0 Information Obtained From Patient Wound History Do you currently have one or more open woundso Yes How many open wounds do you currently haveo 1 Approximately how long have you had your woundso 10/25 How have you been treating your wound(s) until nowo neosporin and steri-strips Has your wound(s) ever healed and then re-openedo No Have you had any lab work done in the past montho No Have you tested positive for an antibiotic resistant organism (MRSA, VRE)o No Have you tested positive for osteomyelitis (bone infection)o No Have you had any tests for circulation on your legso No Have you had other problems associated with your woundso Swelling Constitutional Symptoms (General Health) Complaints and Symptoms: No Complaints or Symptoms Eyes Complaints and Symptoms: No Complaints or Symptoms Ear/Nose/Mouth/Throat Complaints and Symptoms: No Complaints or Symptoms Hematologic/Lymphatic Complaints and Symptoms: No Complaints or Symptoms Respiratory Complaints and Symptoms: No Complaints or Symptoms Cardiovascular Complaints and Symptoms: No Complaints or Symptoms Gastrointestinal Complaints and Symptoms: No Complaints or Symptoms Laurine BlazerGUTHRIE, Sadrac ([id number]) Endocrine Complaints  and Symptoms: No Complaints or Symptoms Genitourinary Complaints and Symptoms: No Complaints or Symptoms Immunological Complaints and Symptoms: No Complaints or Symptoms Integumentary (Skin) Complaints and Symptoms: No Complaints or Symptoms Musculoskeletal Complaints and Symptoms: No Complaints or Symptoms Neurologic Complaints and Symptoms: No Complaints or Symptoms Oncologic Complaints and Symptoms: No Complaints or Symptoms Psychiatric Complaints and Symptoms: No Complaints or Symptoms Immunizations Pneumococcal Vaccine: Received Pneumococcal Vaccination: No Implantable Devices Family and Social History Cancer: Yes - Mother,Paternal Grandparents; Diabetes: No; Heart Disease: Yes - Maternal Grandparents; Hereditary Spherocytosis: No; Hypertension: Yes - Mother,Father; Kidney Disease: No; Lung Disease: No; Seizures: No; Stroke: No; Thyroid Problems: No; Tuberculosis: No; Never smoker; Marital Status - Married; Alcohol Use: Rarely; Drug Use: No History; Caffeine Use: Daily; Financial Concerns: No; Food, Clothing or Shelter Needs: No; Support System Lacking: No; Transportation Concerns: No; Advanced Directives: No; Patient does not want information on Advanced Directives; Do not resuscitate: No; Living Will: No; Medical Power of Attorney: No Electronic Signature(s) Signed: 06/29/2017 2:49:42 PM By: Alejandro MullingPinkerton, Debra Signed: 06/29/2017 4:01:13 PM By: Lenda KelpStone III, Hoyt PA-C Entered By: Alejandro MullingPinkerton, Debra on 06/29/2017 13:58:17 [name] [name], Emad ([id number]) [name] [name], Illya ([id number]) -------------------------------------------------------------------------------- SuperBill Details Patient Name: [name] [name], Aubery Date of Service: 06/29/2017 Medical Record Number: [id number] Patient Account Number: [id number] Date of Birth/Sex: 04/05/1981 (36 y.o. 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 Facility Procedures CPT4: Description Modifier Quantity Code [id number] 99212 - WOUND CARE VISIT-LEV 2 EST PT 1 CPT4: [id number] 11042 - DEB SUBQ TISSUE 20 SQ CM/< 1 ICD-10 Diagnosis Description S61.412A Laceration without foreign body of left hand, initial encounter 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 without evidence of  necrosis Physician Procedures CPT4: Description Modifier Quantity Code [id number] WC PHYS LEVEL 3 o NEW PT 25 1 ICD-10 Diagnosis Description S61.412A Laceration without foreign body of left hand, initial encounter L98.496 Non-pressure chronic ulcer of skin of other sites with bone  involvement without evidence of necrosis CPT4: [id number] 11042 - WC PHYS SUBQ TISS 20 SQ CM 1 ICD-10 Diagnosis Description S61.412A Laceration without foreign body of left hand, initial encounter 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

## 2017-07-05 ENCOUNTER — Encounter: Payer: BLUE CROSS/BLUE SHIELD | Admitting: Internal Medicine

## 2017-07-05 DIAGNOSIS — S61412A Laceration without foreign body of left hand, initial encounter: Secondary | ICD-10-CM | POA: Diagnosis not present

## 2017-07-06 NOTE — Progress Notes (Signed)
[name] [name], [name] [name] ([id number]) Visit Report for 07/05/2017 HPI Details Patient Name: [name] [name], [name] [name] Date of Service: 07/05/2017 3:15 PM Medical Record Number: [id number] Patient Account Number: [id number] Date of Birth/Sex: 10/18/1980 (36 y.o. Male) Treating RN: Curtis Sitesorthy, Joanna Primary Care Provider: Tarri AbernethyABINOWITZ, JOSEPH Other Clinician: Referring Provider: Tarri AbernethyABINOWITZ, JOSEPH Treating Provider/Extender: Maxwell CaulOBSON, MICHAEL G Weeks in Treatment: 0 History of Present Illness Associated Signs and Symptoms: Patient has no major medical problems HPI Description: 06/29/17 on evaluation today patient appears to be doing fairly well other than the fact that he has noted on this initial evaluation and laceration of the left hand in the dorsal web spaces between the second and third fingers. He showed me the initial picture and this was a machine shop tool which calls the laceration and it actually cut down and involve the bone itself. He really has no pain he tells me other than some type feelings other than when the wound is being cleansed such as what I was doing today. No fevers, chills, nausea, or vomiting noted at this time. He does tell me that due to the swelling and tightness he doesn't have full range of motion at this point as he's afraid of extending and worsening the wound with too much motion. He has been using Steri-Strips to help secure this somewhat. He also works as a IT sales professionalfirefighter. Fortunately he has no other major medical problems. Specifically no diabetes. 07/05/17; patient is here for follow-up of a laceration injury on the left hand and the dorsal web space between the second and third fingers. This happened about a month ago. It was sutured then shortly after the sutures were removed the patient then his finger back while dealing with his 36-year-old daughter. This reopened. He is been using silver alginate. He still has a small open area towards distal aspect of the original injury. This has  0.3 cm in depth but it does not probe to bone Electronic Signature(s) Signed: 07/05/2017 4:33:39 PM By: Baltazar Najjarobson, Michael MD Entered By: Baltazar Najjarobson, Michael on 07/05/2017 16:22:04 [name] [name], [name] [name] ([id number]) -------------------------------------------------------------------------------- Physical Exam Details Patient Name: [name] [name], [name] [name] Date of Service: 07/05/2017 3:15 PM Medical Record Number: [id number] Patient Account Number: [id number] Date of Birth/Sex: 01/30/1981 (36 y.o. Male) Treating RN: Curtis Sitesorthy, Joanna Primary Care Provider: Tarri AbernethyABINOWITZ, JOSEPH Other Clinician: Referring Provider: Tarri AbernethyABINOWITZ, JOSEPH Treating Provider/Extender: Maxwell CaulOBSON, MICHAEL G Weeks in Treatment: 0 Constitutional Sitting or standing Blood Pressure is within target range for patient.. Pulse regular and within target range for patient.Marland Kitchen. Respirations regular, non-labored and within target range.. Temperature is normal and within the target range for the patient.Marland Kitchen. appears in no distress. Neurological Normal to light touch on both sides of the second finger. Good range of motion. Notes 07/05/17; small open area remains however it has 0.3 cm of depth. I can't really see the base of this wound with clarity although I think it actually looks quite healthy. There is no drainage Electronic Signature(s) Signed: 07/05/2017 4:33:39 PM By: Baltazar Najjarobson, Michael MD Entered By: Baltazar Najjarobson, Michael on 07/05/2017 16:18:06 [name] [name], [name] [name] ([id number]) -------------------------------------------------------------------------------- Physician Orders Details Patient Name: [name] [name], [name] [name] Date of Service: 07/05/2017 3:15 PM Medical Record Number: [id number] Patient Account Number: [id number] Date of Birth/Sex: 05/12/1981 (36 y.o. Male) Treating RN: Curtis Sitesorthy, Joanna Primary Care Provider: Tarri AbernethyABINOWITZ, JOSEPH Other Clinician: Referring Provider: Tarri AbernethyABINOWITZ, JOSEPH Treating Provider/Extender: Altamese CarolinaOBSON, MICHAEL G Weeks in Treatment: 0 Verbal / Phone  Orders: No Diagnosis Coding Wound Cleansing Wound #1 Left Hand - Web Between 1st and 2nd Digit o Clean wound with Normal Saline. o Cleanse wound with  mild soap and water o May Shower, gently pat wound dry prior to applying new dressing. Anesthetic Wound #1 Left Hand - Web Between 1st and 2nd Digit o Topical Lidocaine 4% cream applied to wound bed prior to debridement Primary Wound Dressing Wound #1 Left Hand - Web Between 1st and 2nd Digit o Other: - endoform antimicrobial Secondary Dressing Wound #1 Left Hand - Web Between 1st and 2nd Digit o Other - coverlet Dressing Change Frequency Wound #1 Left Hand - Web Between 1st and 2nd Digit o Change dressing every day. Follow-up Appointments Wound #1 Left Hand - Web Between 1st and 2nd Digit o Return Appointment in 1 week. Additional Orders / Instructions Wound #1 Left Hand - Web Between 1st and 2nd Digit o Increase protein intake. Electronic Signature(s) Signed: 07/05/2017 4:33:39 PM By: Baltazar Najjar MD Signed: 07/05/2017 5:05:53 PM By: Curtis Sites Entered By: Curtis Sites on 07/05/2017 16:16:14 [name] [name] ([id number]) -------------------------------------------------------------------------------- Problem List Details Patient Name: [name] [name] Date of Service: 07/05/2017 3:15 PM Medical Record Number: [id number] Patient Account Number: [id number] Date of Birth/Sex: June 13, 1981 (36 y.o. 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 Male) Treating RN: Curtis Sites Primary Care Provider: Tarri Abernethy Other Clinician: Referring Provider: Tarri Abernethy Treating Provider/Extender: Maxwell Caul Weeks in Treatment: 0 Subjective History of Present Illness (HPI) The following HPI elements were documented for the patient's wound: Associated Signs and Symptoms: Patient has no major medical problems 06/29/17 on evaluation today patient appears to be doing fairly well other than the fact that he has noted on this initial evaluation and laceration of the left hand in the dorsal web spaces between the second and third fingers. He showed me the initial picture and this was a machine shop tool which calls the laceration and it actually cut down and involve the bone itself. He really has no pain he tells me other than some type feelings other than when the wound is being cleansed such as what I was doing today. No fevers, chills, nausea, or vomiting noted at this time. He does tell me that due to the swelling and tightness he doesn't have full range of motion at this point as he's afraid of extending and worsening the wound with too much motion. He has been using Steri-Strips to help secure this somewhat. He also works as a IT sales professional. Fortunately he has no other major medical problems. Specifically no diabetes. 07/05/17; patient is here for follow-up of a laceration injury on the left hand and the dorsal web space between the second and third fingers. This happened about a month  ago. It was sutured then shortly after the sutures were removed the patient then his finger back while dealing with his 9-year-old daughter. This reopened. He is been using silver alginate. He still has a small open area towards distal aspect of the original injury. This has  0.3 cm in depth but it does not probe to bone Objective Constitutional Sitting or standing Blood Pressure is within target range for patient.. Pulse regular and within target range for patient.Marland Kitchen. Respirations regular, non-labored and within target range.. Temperature is normal and within the target range for the patient.Marland Kitchen. appears in no distress. Vitals Time Taken: 3:40 PM, Height: 69 in, Weight: 190.3 lbs, BMI: 28.1, Temperature: 98.1 F, Pulse: 59 bpm, Respiratory Rate: 18 breaths/min, Blood Pressure: 130/77 mmHg. Neurological Normal to light touch on both sides of the second finger. Good range of motion. General Notes: 07/05/17; small open area remains however it has 0.3 cm of depth. I can't really see the base of this wound with clarity although I think it actually looks quite healthy. There is no drainage Integumentary (Hair, Skin) Wound #1 status is Open. Original cause of wound was Trauma. The wound is located on the Left Hand - Web Between 1st and 2nd Digit. The wound measures 1.5cm length x 0.2cm width x 0.3cm depth; 0.236cm^2 area and 0.071cm^3 volume. There is tendon and Fat Layer (Subcutaneous Tissue) Exposed exposed. There is no tunneling or undermining noted. There is a large Repetto, Jamelle [id number]) amount of serous drainage noted. The wound margin is distinct with the outline attached to the wound base. There is large (67-100%) pink granulation within the wound bed. There is a small (1-33%) amount of necrotic tissue within the wound bed including Adherent Slough. The periwound skin appearance exhibited: Scarring. The periwound skin appearance did not exhibit: Callus, Crepitus, Excoriation, Induration,  Rash, Dry/Scaly, Maceration, Atrophie Blanche, Cyanosis, Ecchymosis, Hemosiderin Staining, Mottled, Pallor, Rubor, Erythema. Periwound temperature was noted as No Abnormality. The periwound has tenderness on palpation. 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 Cleanse wound with mild soap and water May Shower, gently pat wound dry prior to applying new dressing. Anesthetic: Wound #1 Left Hand - Web Between 1st and 2nd Digit: Topical Lidocaine 4% cream applied to wound bed prior to debridement Primary Wound Dressing: Wound #1 Left Hand - Web Between 1st and 2nd Digit: Other: - endoform antimicrobial Secondary Dressing: Wound #1 Left Hand - Web Between 1st and 2nd Digit: Other - coverlet Dressing Change Frequency: Wound #1 Left Hand - Web Between 1st and 2nd Digit: Change dressing every day. Follow-up Appointments: Wound #1 Left Hand - Web Between 1st and 2nd Digit: Return Appointment in 1 week. Additional Orders / Instructions: Wound #1 Left Hand - Web Between 1st and 2nd Digit: Increase protein intake. change primary dressing to endoform change daily [name] [name], Gibran ([id number]) Electronic Signature(s) Signed: 07/05/2017 4:22:38 PM By: Baltazar Najjarobson, Michael MD Entered By: Baltazar Najjarobson, Michael on 07/05/2017 16:22:38 [name] [name], Vernice ([id number]) -------------------------------------------------------------------------------- SuperBill Details Patient Name: [name] [name], Abner Date of Service: 07/05/2017 Medical Record Number: [id number] Patient Account Number: [id number] Date of Birth/Sex: 03/07/1981 (36 y.o. 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 Facility Procedures CPT4 Code: [id number] Description: [id number] - WOUND CARE VISIT-LEV 2 EST PT Modifier: Quantity:  1 Physician Procedures CPT4: Description Modifier Quantity Code [id number] [phone] - WC PHYS LEVEL 2 - EST PT 1 ICD-10 Diagnosis Description 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

## 2017-07-06 NOTE — Progress Notes (Signed)
[name] [name], [name] [name] ([id number]) Visit Report for 07/05/2017 Arrival Information Details Patient Name: [name] [name], [name] [name] Date of Service: 07/05/2017 3:15 PM Medical Record Number: [id number] Patient Account Number: [id number] Date of Birth/Sex: 06/15/1981 (36 y.o. Male) Treating RN: [name] [name], [name] Primary Care Marlene Beidler: Tarri AbernethyABINOWITZ, JOSEPH Other Clinician: Referring Paije Goodhart: Tarri AbernethyABINOWITZ, JOSEPH Treating Martia Dalby/Extender: Maxwell CaulOBSON, MICHAEL G Weeks in Treatment: 0 Visit Information History Since Last Visit Added or deleted any medications: No Patient Arrived: Ambulatory Any new allergies or adverse reactions: No Arrival Time: 15:36 Had a fall or experienced change in No Accompanied By: self activities of daily living that may affect Transfer Assistance: None risk of falls: Patient Identification Verified: Yes Signs or symptoms of abuse/neglect since last visito No Secondary Verification Process Completed: Yes Hospitalized since last visit: No Patient Requires Transmission-Based No Has Dressing in Place as Prescribed: Yes Precautions: Pain Present Now: No Patient Has Alerts: No Electronic Signature(s) Signed: 07/05/2017 5:05:53 PM By: [name] [name], [name] Entered By: [name] [name], [name] on 07/05/2017 15:36:18 [name] [name], [name] [name] ([id number]) -------------------------------------------------------------------------------- Clinic Level of Care Assessment Details Patient Name: [name] [name], [name] [name] Date of Service: 07/05/2017 3:15 PM Medical Record Number: [id number] Patient Account Number: [id number] Date of Birth/Sex: 08/23/1980 (36 y.o. Male) Treating RN: [name] [name], [name] Primary Care Krystan Northrop: Tarri AbernethyABINOWITZ, JOSEPH Other Clinician: Referring Kristan Brummitt: Tarri AbernethyABINOWITZ, JOSEPH Treating Yehudit Fulginiti/Extender: Altamese CarolinaOBSON, MICHAEL G Weeks in Treatment: 0 Clinic Level of Care Assessment Items TOOL 4 Quantity Score []  - Use when only an EandM is performed on FOLLOW-UP visit 0 ASSESSMENTS - Nursing Assessment / Reassessment X -  Reassessment of Co-morbidities (includes updates in patient status) 1 10 X- 1 5 Reassessment of Adherence to Treatment Plan ASSESSMENTS - Wound and Skin Assessment / Reassessment X - Simple Wound Assessment / Reassessment - one wound 1 5 []  - 0 Complex Wound Assessment / Reassessment - multiple wounds []  - 0 Dermatologic / Skin Assessment (not related to wound area) ASSESSMENTS - Focused Assessment []  - Circumferential Edema Measurements - multi extremities 0 []  - 0 Nutritional Assessment / Counseling / Intervention []  - 0 Lower Extremity Assessment (monofilament, tuning fork, pulses) []  - 0 Peripheral Arterial Disease Assessment (using hand held doppler) ASSESSMENTS - Ostomy and/or Continence Assessment and Care []  - Incontinence Assessment and Management 0 []  - 0 Ostomy Care Assessment and Management (repouching, etc.) PROCESS - Coordination of Care X - Simple Patient / Family Education for ongoing care 1 15 []  - 0 Complex (extensive) Patient / Family Education for ongoing care []  - 0 Staff obtains ChiropractorConsents, Records, Test Results / Process Orders []  - 0 Staff telephones HHA, Nursing Homes / Clarify orders / etc []  - 0 Routine Transfer to another Facility (non-emergent condition) []  - 0 Routine Hospital Admission (non-emergent condition) []  - 0 New Admissions / Manufacturing engine[employer]surance Authorizations / Ordering NPWT, Apligraf, etc. []  - 0 Emergency Hospital Admission (emergent condition) X- 1 10 Simple Discharge Coordination [name] [name], [name] [name] ([id number]) []  - 0 Complex (extensive) Discharge Coordination PROCESS - Special Needs []  - Pediatric / Minor Patient Management 0 []  - 0 Isolation Patient Management []  - 0 Hearing / Language / Visual special needs []  - 0 Assessment of Community assistance (transportation, D/C planning, etc.) []  - 0 Additional assistance / Altered mentation []  - 0 Support Surface(s) Assessment (bed, cushion, seat, etc.) INTERVENTIONS - Wound Cleansing /  Measurement X - Simple Wound Cleansing - one wound 1 5 []  - 0 Complex Wound Cleansing - multiple wounds X- 1 5 Wound Imaging (photographs - any number of wounds) []  - 0 Wound Tracing (instead of photographs) X-  1 5 Simple Wound Measurement - one wound []  - 0 Complex Wound Measurement - multiple wounds INTERVENTIONS - Wound Dressings X - Small Wound Dressing one or multiple wounds 1 10 []  - 0 Medium Wound Dressing one or multiple wounds []  - 0 Large Wound Dressing one or multiple wounds []  - 0 Application of Medications - topical []  - 0 Application of Medications - injection INTERVENTIONS - Miscellaneous []  - External ear exam 0 []  - 0 Specimen Collection (cultures, biopsies, blood, body fluids, etc.) []  - 0 Specimen(s) / Culture(s) sent or taken to Lab for analysis []  - 0 Patient Transfer (multiple staff / Nurse, adultHoyer Lift / Similar devices) []  - 0 Simple Staple / Suture removal (25 or less) []  - 0 Complex Staple / Suture removal (26 or more) []  - 0 Hypo / Hyperglycemic Management (close monitor of Blood Glucose) []  - 0 Ankle / Brachial Index (ABI) - do not check if billed separately X- 1 5 Vital Signs Bumgardner, Kealan ([id number]) Has the patient been seen at the hospital within the last three years: Yes Total Score: 75 Level Of Care: New/Established - Level 2 Electronic Signature(s) Signed: 07/05/2017 5:05:53 PM By: [name] [name], [name] Entered By: [name] [name], [name] on 07/05/2017 16:36:46 [name] [name], [name] [name] ([id number]) -------------------------------------------------------------------------------- Encounter Discharge Information Details Patient Name: [name] [name], [name] [name] Date of Service: 07/05/2017 3:15 PM Medical Record Number: [id number] Patient Account Number: [id number] Date of Birth/Sex: 08/28/1980 2(36 y.o. Male) Treating RN: [name] [name], [name] Primary Care Akhilesh Sassone: Tarri AbernethyABINOWITZ, JOSEPH Other Clinician: Referring Anira Senegal: Tarri AbernethyABINOWITZ, JOSEPH Treating Javoni Lucken/Extender: Altamese CarolinaOBSON, MICHAEL  G Weeks in Treatment: 0 Encounter Discharge Information Items Discharge Pain Level: 0 Discharge Condition: Stable Ambulatory Status: Ambulatory Discharge Destination: Home Private Transportation: Auto Accompanied By: self Schedule Follow-up Appointment: Yes Medication Reconciliation completed and provided No to Patient/Care Constancia Geeting: Clinical Summary of Care: Provided Form Type Recipient Paper Patient jg Electronic Signature(s) Signed: 07/05/2017 4:38:42 PM By: [name] [name], [name] Previous Signature: 07/05/2017 4:20:23 PM Version By: Dayton MartesWallace, RCP,RRT,CHT, Sallie RCP, RRT, CHT Entered By: [name] [name], [name] on 07/05/2017 16:38:41 [name] [name], [name] [name] ([id number]) -------------------------------------------------------------------------------- Multi Wound Chart Details Patient Name: [name] [name], [name] [name] Date of Service: 07/05/2017 3:15 PM Medical Record Number: [id number] Patient Account Number: [id number] Date of Birth/Sex: 01/17/1981 (36 y.o. Male) Treating RN: [name] [name], [name] Primary Care Valori Hollenkamp: Tarri AbernethyABINOWITZ, JOSEPH Other Clinician: Referring Eh Sesay: Tarri AbernethyABINOWITZ, JOSEPH Treating Linn Goetze/Extender: Maxwell CaulOBSON, MICHAEL G Weeks in Treatment: 0 Vital Signs Height(in): 69 Pulse(bpm): 59 Weight(lbs): 190.3 Blood Pressure(mmHg): 130/77 Body Mass Index(BMI): 28 Temperature(F): 98.1 Respiratory Rate 18 (breaths/min): Photos: [1:No Photos] [N/A:N/A] Wound Location: [1:Left Hand - Web Between 1st and 2nd Digit] [N/A:N/A] Wounding Event: [1:Trauma] [N/A:N/A] Primary Etiology: [1:Trauma, Other] [N/A:N/A] Date Acquired: [1:06/02/2017] [N/A:N/A] Weeks of Treatment: [1:0] [N/A:N/A] Wound Status: [1:Open] [N/A:N/A] Measurements L x W x D [1:1.5x0.2x0.3] [N/A:N/A] (cm) Area (cm) : [1:0.236] [N/A:N/A] Volume (cm) : [1:0.071] [N/A:N/A] % Reduction in Area: [1:49.90%] [N/A:N/A] % Reduction in Volume: [1:49.60%] [N/A:N/A] Classification: [1:Full Thickness With Exposed Support Structures]  [N/A:N/A] Exudate Amount: [1:Large] [N/A:N/A] Exudate Type: [1:Serous] [N/A:N/A] Exudate Color: [1:amber] [N/A:N/A] Wound Margin: [1:Distinct, outline attached] [N/A:N/A] Granulation Amount: [1:Large (67-100%)] [N/A:N/A] Granulation Quality: [1:Pink] [N/A:N/A] Necrotic Amount: [1:Small (1-33%)] [N/A:N/A] Exposed Structures: [1:Fat Layer (Subcutaneous Tissue) Exposed: Yes Tendon: Yes Fascia: No Muscle: No Joint: No Bone: No] [N/A:N/A] Epithelialization: [1:None] [N/A:N/A] Periwound Skin Texture: [1:Scarring: Yes Excoriation: No Induration: No Callus: No Crepitus: No Rash: No] [N/A:N/A] Periwound Skin Moisture: Maceration: No N/A N/A Dry/Scaly: No Periwound Skin Color: Atrophie Blanche: No N/A N/A Cyanosis: No Ecchymosis: No Erythema: No Hemosiderin Staining: No Mottled: No Pallor:  No Rubor: No Temperature: No Abnormality N/A N/A Tenderness on Palpation: Yes N/A N/A Wound Preparation: Ulcer Cleansing: N/A N/A Rinsed/Irrigated with Saline Topical Anesthetic Applied: Other: lidocaine 4% Treatment Notes Electronic Signature(s) Signed: 07/05/2017 4:33:39 PM By: Baltazar Najjar MD Entered By: Baltazar Najjar on 07/05/2017 16:13:46 [name] Neighbors ([id number]) -------------------------------------------------------------------------------- Multi-Disciplinary Care Plan Details Patient Name: [name] Neighbors Date of Service: 07/05/2017 3:15 PM Medical Record Number: [id number] Patient Account Number: [id number] Date of Birth/Sex: 04-28-1981 (36 y.o. Male) Treating RN: [name] Sites Primary Care Matalyn Nawaz: Tarri Abernethy Other Clinician: Referring Chaela Branscum: Tarri Abernethy Treating Nirali Magouirk/Extender: Altamese [place] in Treatment: 0 Active Inactive ` Orientation to the Wound Care Program Nursing Diagnoses: Knowledge deficit related to the wound healing center program Goals: Patient/caregiver will verbalize understanding of the Wound Healing Center Program Date  Initiated: 06/29/2017 Target Resolution Date: 07/16/2017 Goal Status: Active Interventions: Provide education on orientation to the wound center Notes: ` Wound/Skin Impairment Nursing Diagnoses: Impaired tissue integrity Knowledge deficit related to ulceration/compromised skin integrity Goals: Ulcer/skin breakdown will have a volume reduction of 80% by week 12 Date Initiated: 06/29/2017 Target Resolution Date: 09/17/2017 Goal Status: Active Interventions: Assess patient/caregiver ability to perform ulcer/skin care regimen upon admission and as needed Assess ulceration(s) every visit Notes: Electronic Signature(s) Signed: 07/05/2017 5:05:53 PM By: [name] Sites Entered By: [name] Sites on 07/05/2017 15:53:24 [name] Neighbors ([id number]) -------------------------------------------------------------------------------- Pain Assessment Details Patient Name: [name] Neighbors Date of Service: 07/05/2017 3:15 PM Medical Record Number: [id number] Patient Account Number: [id number] Date of Birth/Sex: [birth year] (36 y.o. Male) Treating RN: [name] Sites Primary Care Esbeydi Manago: Tarri Abernethy Other Clinician: Referring Murad Staples: Tarri Abernethy Treating Talon Regala/Extender: Maxwell Caul Weeks in Treatment: 0 Active Problems Location of Pain Severity and Description of Pain Patient Has Paino Yes Site Locations Pain Location: Pain in Ulcers With Dressing Change: Yes Duration of the Pain. Constant / Intermittento Intermittent Pain Management and Medication Current Pain Management: Notes Topical or injectable lidocaine is offered to patient for acute pain when surgical debridement is performed. If needed, Patient is instructed to use over the counter pain medication for the following 24-48 hours after debridement. Wound care MDs do not prescribed pain medications. Patient has chronic pain or uncontrolled pain. Patient has been instructed to make an appointment with their  Primary Care Physician for pain management. Electronic Signature(s) Signed: 07/05/2017 5:05:53 PM By: [name] Sites Entered By: [name] Sites on 07/05/2017 15:36:30 [name] Neighbors ([id number]) -------------------------------------------------------------------------------- Patient/Caregiver Education Details Patient Name: [name] Neighbors Date of Service: 07/05/2017 3:15 PM Medical Record Number: [id number] Patient Account Number: [id number] Date of Birth/Gender: [birth year] (36 y.o. Male) Treating RN: [name] Sites Primary Care Physician: Tarri Abernethy Other Clinician: Referring Physician: Tarri Abernethy Treating Physician/Extender: Altamese [place] in Treatment: 0 Education Assessment Education Provided To: Patient Education Topics Provided Wound/Skin Impairment: Handouts: Other: wound care as ordered Methods: Demonstration, Explain/Verbal Responses: State content correctly Electronic Signature(s) Signed: 07/05/2017 5:05:53 PM By: [name] Sites Entered By: [name] Sites on 07/05/2017 16:39:30 [name] Neighbors ([id number]) -------------------------------------------------------------------------------- Wound Assessment Details Patient Name: [name] Neighbors Date of Service: 07/05/2017 3:15 PM Medical Record Number: [id number] Patient Account Number: [id number] Date of Birth/Sex: 1980-11-08 (36 y.o. Male) Treating RN: [name] Sites Primary Care Aino Heckert: Tarri Abernethy Other Clinician: Referring Tadeusz Stahl: Tarri Abernethy Treating Patric Buckhalter/Extender: Maxwell Caul Weeks in Treatment: 0 Wound Status Wound Number: 1 Primary Etiology: Trauma, Other Wound Location: Left Hand - Web Between 1st and 2nd Wound Status: Open Digit Wounding Event: Trauma Date Acquired: 06/02/2017 Weeks Of Treatment: 0 Clustered Wound: No Wound Measurements  Length: (cm) 1.5 Width: (cm) 0.2 Depth: (cm) 0.3 Area: (cm) 0.236 Volume: (cm) 0.071 % Reduction in Area:  49.9% % Reduction in Volume: 49.6% Epithelialization: None Tunneling: No Undermining: No Wound Description Full Thickness With Exposed Support Classification: Structures Wound Margin: Distinct, outline attached Exudate Large Amount: Exudate Type: Serous Exudate Color: amber Foul Odor After Cleansing: No Slough/Fibrino Yes Wound Bed Granulation Amount: Large (67-100%) Exposed Structure Granulation Quality: Pink Fascia Exposed: No Necrotic Amount: Small (1-33%) Fat Layer (Subcutaneous Tissue) Exposed: Yes Necrotic Quality: Adherent Slough Tendon Exposed: Yes Muscle Exposed: No Joint Exposed: No Bone Exposed: No Periwound Skin Texture Texture Color No Abnormalities Noted: No No Abnormalities Noted: No Callus: No Atrophie Blanche: No Crepitus: No Cyanosis: No Excoriation: No Ecchymosis: No Induration: No Erythema: No Rash: No Hemosiderin Staining: No Scarring: Yes Mottled: No Pallor: No Moisture Rubor: No No Abnormalities Noted: No Devino, Kharee ([id number]) Dry / Scaly: No Temperature / Pain Maceration: No Temperature: No Abnormality Tenderness on Palpation: Yes Wound Preparation Ulcer Cleansing: Rinsed/Irrigated with Saline Topical Anesthetic Applied: Other: lidocaine 4%, Treatment Notes Wound #1 (Left Hand - Web Between 1st and 2nd Digit) 1. Cleansed with: Clean wound with Normal Saline 2. Anesthetic Topical Lidocaine 4% cream to wound bed prior to debridement 4. Dressing Applied: Other dressing (specify in notes) Notes endoform antimicrobial, coverlet Electronic Signature(s) Signed: 07/05/2017 5:05:53 PM By: [name] Sites Entered By: [name] Sites on 07/05/2017 15:40:03 [name] Neighbors ([id number]) -------------------------------------------------------------------------------- Vitals Details Patient Name: [name] Neighbors Date of Service: 07/05/2017 3:15 PM Medical Record Number: [id number] Patient Account Number: [id number] Date of  Birth/Sex: 1981/04/16 (36 y.o. Male) Treating RN: [name] Sites Primary Care Elliett Guarisco: Tarri Abernethy Other Clinician: Referring Takumi Din: Tarri Abernethy Treating Kielyn Kardell/Extender: Maxwell Caul Weeks in Treatment: 0 Vital Signs Time Taken: 15:40 Temperature (F): 98.1 Height (in): 69 Pulse (bpm): 59 Weight (lbs): 190.3 Respiratory Rate (breaths/min): 18 Body Mass Index (BMI): 28.1 Blood Pressure (mmHg): 130/77 Reference Range: 80 - 120 mg / dl Electronic Signature(s) Signed: 07/05/2017 5:05:53 PM By: [name] Sites Entered By: [name] Sites on 07/05/2017 15:47:44

## 2017-07-06 NOTE — Progress Notes (Addendum)
KAVIR, SAVOCA ([id number]) Visit Report for 06/29/2017 Allergy List Details Patient Name: [name] [name], [name] [name] Date of Service: 06/29/2017 1:30 PM Medical Record Number: [id number] Patient Account Number: [id number] Date of Birth/Sex: [birth year] (36 y.o. Male) Treating RN: Phillis Haggis Primary Care Merlyn Bollen: Tarri Abernethy Other Clinician: Referring Antwan Pandya: Tarri Abernethy Treating Parul Porcelli/Extender: STONE III, HOYT Weeks in Treatment: 0 Allergies Active Allergies NKDA Allergy Notes Electronic Signature(s) Signed: 06/29/2017 2:49:42 PM By: Alejandro Mulling Entered By: Alejandro Mulling on 06/29/2017 13:54:41 [name] [name] ([id number]) -------------------------------------------------------------------------------- Arrival Information Details Patient Name: [name] [name] Date of Service: 06/29/2017 1:30 PM Medical Record Number: [id number] Patient Account Number: [id number] Date of Birth/Sex: 08/10/1980 (36 y.o. Male) Treating RN: Phillis Haggis Primary Care Corrissa Martello: Tarri Abernethy Other Clinician: Referring Giada Schoppe: Tarri Abernethy Treating Trenita Hulme/Extender: Linwood Dibbles, HOYT Weeks in Treatment: 0 Visit Information Patient Arrived: Ambulatory Arrival Time: 13:50 Accompanied By: self Transfer Assistance: None Patient Identification Verified: Yes Secondary Verification Process Completed: Yes Patient Requires Transmission-Based No Precautions: Patient Has Alerts: No Electronic Signature(s) Signed: 06/29/2017 2:49:42 PM By: Alejandro Mulling Entered By: Alejandro Mulling on 06/29/2017 13:51:09 [name] [name] ([id number]) -------------------------------------------------------------------------------- Clinic Level of Care Assessment Details Patient Name: [name] [name] Date of Service: 06/29/2017 1:30 PM Medical Record Number: [id number] Patient Account Number: [id number] Date of Birth/Sex: [birth year] (36 y.o. Male) Treating RN: Phillis Haggis Primary Care  Karlyn Glasco: Tarri Abernethy Other Clinician: Referring Kartier Bennison: Tarri Abernethy Treating Ruhan Borak/Extender: Linwood Dibbles, HOYT Weeks in Treatment: 0 Clinic Level of Care Assessment Items TOOL 1 Quantity Score X - Use when EandM and Procedure is performed on INITIAL visit 1 0 ASSESSMENTS - Nursing Assessment / Reassessment X - General Physical Exam (combine w/ comprehensive assessment (listed just below) when 1 20 performed on new pt. evals) X- 1 25 Comprehensive Assessment (HX, ROS, Risk Assessments, Wounds Hx, etc.) ASSESSMENTS - Wound and Skin Assessment / Reassessment []  - Dermatologic / Skin Assessment (not related to wound area) 0 ASSESSMENTS - Ostomy and/or Continence Assessment and Care []  - Incontinence Assessment and Management 0 []  - 0 Ostomy Care Assessment and Management (repouching, etc.) PROCESS - Coordination of Care X - Simple Patient / Family Education for ongoing care 1 15 []  - 0 Complex (extensive) Patient / Family Education for ongoing care []  - 0 Staff obtains Chiropractor, Records, Test Results / Process Orders []  - 0 Staff telephones HHA, Nursing Homes / Clarify orders / etc []  - 0 Routine Transfer to another Facility (non-emergent condition) []  - 0 Routine Hospital Admission (non-emergent condition) X- 1 15 New Admissions / Manufacturing engineer / Ordering NPWT, Apligraf, etc. []  - 0 Emergency Hospital Admission (emergent condition) PROCESS - Special Needs []  - Pediatric / Minor Patient Management 0 []  - 0 Isolation Patient Management []  - 0 Hearing / Language / Visual special needs []  - 0 Assessment of Community assistance (transportation, D/C planning, etc.) []  - 0 Additional assistance / Altered mentation []  - 0 Support Surface(s) Assessment (bed, cushion, seat, etc.) Delbridge, Saylor ([id number]) INTERVENTIONS - Miscellaneous []  - External ear exam 0 []  - 0 Patient Transfer (multiple staff / Nurse, adult / Similar devices) []  - 0 Simple  Staple / Suture removal (25 or less) []  - 0 Complex Staple / Suture removal (26 or more) []  - 0 Hypo/Hyperglycemic Management (do not check if billed separately) []  - 0 Ankle / Brachial Index (ABI) - do not check if billed separately Has the patient been seen at the hospital within the last three years: Yes Total Score: 75 Level Of Care: New/Established - Level 2  Electronic Signature(s) Signed: 06/29/2017 2:49:42 PM By: Alejandro MullingPinkerton, Debra Entered By: Alejandro MullingPinkerton, Debra on 06/29/2017 14:43:12 [name] NeighborsGUTHRIE, [name] [name] ([id number]) -------------------------------------------------------------------------------- Encounter Discharge Information Details Patient Name: [name] NeighborsGUTHRIE, [name] [name] Date of Service: 06/29/2017 1:30 PM Medical Record Number: [id number] Patient Account Number: [id number] Date of Birth/Sex: 03/01/1981 (36 y.o. Male) Treating RN: Phillis HaggisPinkerton, Debi Primary Care Elgie Maziarz: Tarri AbernethyABINOWITZ, JOSEPH Other Clinician: Referring Yacine Garriga: Tarri AbernethyABINOWITZ, JOSEPH Treating Janeva Peaster/Extender: Linwood DibblesSTONE III, HOYT Weeks in Treatment: 0 Encounter Discharge Information Items Discharge Pain Level: 0 Discharge Condition: Stable Ambulatory Status: Ambulatory Discharge Destination: Home Transportation: Private Auto Accompanied By: self Schedule Follow-up Appointment: Yes Medication Reconciliation completed and No provided to Patient/Care Kealii Thueson: Provided on Clinical Summary of Care: 06/29/2017 Form Type Recipient Paper Patient JG Electronic Signature(s) Signed: 07/06/2017 9:04:53 AM By: Alejandro MullingPinkerton, Debra Entered By: Alejandro MullingPinkerton, Debra on 07/06/2017 09:04:53 [name] NeighborsGUTHRIE, [name] [name] ([id number]) -------------------------------------------------------------------------------- Lower Extremity Assessment Details Patient Name: [name] NeighborsGUTHRIE, [name] [name] Date of Service: 06/29/2017 1:30 PM Medical Record Number: [id number] Patient Account Number: [id number] Date of Birth/Sex: 03/10/1981 (36 y.o. Male) Treating RN: Phillis HaggisPinkerton, Debi Primary  Care Reyes Fifield: Tarri AbernethyABINOWITZ, JOSEPH Other Clinician: Referring Rida Loudin: Tarri AbernethyABINOWITZ, JOSEPH Treating Samuel Mcpeek/Extender: Linwood DibblesSTONE III, HOYT Weeks in Treatment: 0 Electronic Signature(s) Signed: 06/29/2017 2:49:42 PM By: Alejandro MullingPinkerton, Debra Entered By: Alejandro MullingPinkerton, Debra on 06/29/2017 13:59:48 [name] NeighborsGUTHRIE, [name] [name] ([id number]) -------------------------------------------------------------------------------- Multi Wound Chart Details Patient Name: [name] NeighborsGUTHRIE, [name] [name] Date of Service: 06/29/2017 1:30 PM Medical Record Number: [id number] Patient Account Number: [id number] Date of Birth/Sex: 03/12/1981 (36 y.o. Male) Treating RN: Phillis HaggisPinkerton, Debi Primary Care Cheree Fowles: Tarri AbernethyABINOWITZ, JOSEPH Other Clinician: Referring Farryn Linares: Tarri AbernethyABINOWITZ, JOSEPH Treating Lupita Rosales/Extender: Linwood DibblesSTONE III, HOYT Weeks in Treatment: 0 Vital Signs Height(in): 69 Pulse(bpm): 71 Weight(lbs): 190.3 Blood Pressure(mmHg): 136/82 Body Mass Index(BMI): 28 Temperature(F): 98.3 Respiratory Rate 20 (breaths/min): Photos: [1:No Photos] [N/A:N/A] Wound Location: [1:Left Hand - Web Between 1st and 2nd Digit] [N/A:N/A] Wounding Event: [1:Trauma] [N/A:N/A] Primary Etiology: [1:Trauma, Other] [N/A:N/A] Date Acquired: [1:06/02/2017] [N/A:N/A] Weeks of Treatment: [1:0] [N/A:N/A] Wound Status: [1:Open] [N/A:N/A] Measurements L x W x D [1:2x0.3x0.3] [N/A:N/A] (cm) Area (cm) : [1:0.471] [N/A:N/A] Volume (cm) : [1:0.141] [N/A:N/A] Classification: [1:Partial Thickness] [N/A:N/A] Exudate Amount: [1:Large] [N/A:N/A] Exudate Type: [1:Serous] [N/A:N/A] Exudate Color: [1:amber] [N/A:N/A] Wound Margin: [1:Distinct, outline attached] [N/A:N/A] Granulation Amount: [1:None Present (0%)] [N/A:N/A] Necrotic Amount: [1:Large (67-100%)] [N/A:N/A] Necrotic Tissue: [1:Eschar, Adherent Slough] [N/A:N/A] Epithelialization: [1:None] [N/A:N/A] Periwound Skin Texture: [1:Scarring: Yes] [N/A:N/A] Periwound Skin Moisture: [1:Maceration: Yes] [N/A:N/A] Periwound  Skin Color: [1:No Abnormalities Noted] [N/A:N/A] Temperature: [1:No Abnormality] [N/A:N/A] Tenderness on Palpation: [1:Yes] [N/A:N/A] Wound Preparation: [1:Ulcer Cleansing: Rinsed/Irrigated with Saline] [N/A:N/A] Topical Anesthetic Applied: Other: lidocaine 4% Treatment Notes Electronic Signature(s) Signed: 06/29/2017 2:49:42 PM By: Ruthell RummagePinkerton, Debra Fadely, Riki RuskJEREMY ([id number]) Entered By: Alejandro MullingPinkerton, Debra on 06/29/2017 14:10:00 [name] NeighborsGUTHRIE, Firas ([id number]) -------------------------------------------------------------------------------- Multi-Disciplinary Care Plan Details Patient Name: [name] NeighborsGUTHRIE, [name] [name] Date of Service: 06/29/2017 1:30 PM Medical Record Number: [id number] Patient Account Number: [id number] Date of Birth/Sex: 03/21/1981 (36 y.o. Male) Treating RN: Phillis HaggisPinkerton, Debi Primary Care Shanetta Nicolls: Tarri AbernethyABINOWITZ, JOSEPH Other Clinician: Referring Winta Barcelo: Tarri AbernethyABINOWITZ, JOSEPH Treating Giordano Getman/Extender: Linwood DibblesSTONE III, HOYT Weeks in Treatment: 0 Active Inactive ` Orientation to the Wound Care Program Nursing Diagnoses: Knowledge deficit related to the wound healing center program Goals: Patient/caregiver will verbalize understanding of the Wound Healing Center Program Date Initiated: 06/29/2017 Target Resolution Date: 07/16/2017 Goal Status: Active Interventions: Provide education on orientation to the wound center Notes: ` Wound/Skin Impairment Nursing Diagnoses: Impaired tissue integrity Knowledge deficit related to ulceration/compromised skin integrity Goals: Ulcer/skin breakdown will have a volume reduction of 80% by week 12 Date Initiated: 06/29/2017 Target Resolution Date: 09/17/2017 Goal Status: Active Interventions:  Assess patient/caregiver ability to perform ulcer/skin care regimen upon admission and as needed Assess ulceration(s) every visit Notes: Electronic Signature(s) Signed: 06/29/2017 2:49:42 PM By: Alejandro MullingPinkerton, Debra Entered By: Alejandro MullingPinkerton, Debra on 06/29/2017  14:09:49 [name] NeighborsGUTHRIE, [name] [name] ([id number]) -------------------------------------------------------------------------------- Pain Assessment Details Patient Name: [name] NeighborsGUTHRIE, [name] [name] Date of Service: 06/29/2017 1:30 PM Medical Record Number: [id number] Patient Account Number: [id number] Date of Birth/Sex: 11/19/1980 [age] y.o. Male) Treating RN: Phillis HaggisPinkerton, Debi Primary Care Docie Abramovich: Tarri AbernethyABINOWITZ, JOSEPH Other Clinician: Referring Godwin Tedesco: Tarri AbernethyABINOWITZ, JOSEPH Treating Rondale Nies/Extender: STONE III, HOYT Weeks in Treatment: 0 Active Problems Location of Pain Severity and Description of Pain Patient Has Paino No Site Locations Pain Management and Medication Current Pain Management: Electronic Signature(s) Signed: 06/29/2017 2:49:42 PM By: Alejandro MullingPinkerton, Debra Entered By: Alejandro MullingPinkerton, Debra on 06/29/2017 13:51:14 [name] NeighborsGUTHRIE, [name] [name] ([id number]) -------------------------------------------------------------------------------- Patient/Caregiver Education Details Patient Name: [name] NeighborsGUTHRIE, Eisa Date of Service: 06/29/2017 1:30 PM Medical Record Number: [id number] Patient Account Number: [id number] Date of Birth/Gender: 05/18/1981 (36 y.o. Male) Treating RN: Phillis HaggisPinkerton, Debi Primary Care Physician: Tarri AbernethyABINOWITZ, JOSEPH Other Clinician: Referring Physician: Tarri AbernethyABINOWITZ, JOSEPH Treating Physician/Extender: Skeet SimmerSTONE III, HOYT Weeks in Treatment: 0 Education Assessment Education Provided To: Patient Education Topics Provided Welcome To The Wound Care Center: Handouts: Welcome To The Wound [facility] Methods: Explain/Verbal Responses: State content correctly Wound/Skin Impairment: Handouts: Caring for Your Ulcer, Other: change dressing as ordered Methods: Demonstration, Explain/Verbal Responses: State content correctly Electronic Signature(s) Signed: 07/07/2017 3:00:11 PM By: Alejandro MullingPinkerton, Debra Previous Signature: 06/29/2017 2:49:42 PM Version By: Alejandro MullingPinkerton, Debra Entered By: Alejandro MullingPinkerton, Debra on 07/06/2017  09:05:02 [name] NeighborsGUTHRIE, Amore ([id number]) -------------------------------------------------------------------------------- Wound Assessment Details Patient Name: [name] NeighborsGUTHRIE, Traves Date of Service: 06/29/2017 1:30 PM Medical Record Number: [id number] Patient Account Number: [id number] Date of Birth/Sex: 01/16/1981 [age] y.o. Male) Treating RN: Phillis HaggisPinkerton, Debi Primary Care Tc Kapusta: Tarri AbernethyABINOWITZ, JOSEPH Other Clinician: Referring Idus Rathke: Tarri AbernethyABINOWITZ, JOSEPH Treating Yury Schaus/Extender: STONE III, HOYT Weeks in Treatment: 0 Wound Status Wound Number: 1 Primary Etiology: Trauma, Other Wound Location: Left Hand - Web Between 1st and 2nd Wound Status: Open Digit Wounding Event: Trauma Date Acquired: 06/02/2017 Weeks Of Treatment: 0 Clustered Wound: No Photos Photo Uploaded By: Alejandro MullingPinkerton, Debra on 06/29/2017 14:38:57 Wound Measurements Length: (cm) 2 Width: (cm) 0.3 Depth: (cm) 0.3 Area: (cm) 0.471 Volume: (cm) 0.141 % Reduction in Area: % Reduction in Volume: Epithelialization: None Tunneling: No Wound Description Classification: Partial Thickness Wound Margin: Distinct, outline attached Exudate Amount: Large Exudate Type: Serous Exudate Color: amber Foul Odor After Cleansing: No Slough/Fibrino Yes Wound Bed Granulation Amount: None Present (0%) Necrotic Amount: Large (67-100%) Necrotic Quality: Eschar, Adherent Slough Periwound Skin Texture Texture Color No Abnormalities Noted: No No Abnormalities Noted: No Scarring: Yes Temperature / Pain Haig, Raja ([id number]) Moisture Temperature: No Abnormality No Abnormalities Noted: No Tenderness on Palpation: Yes Maceration: Yes Wound Preparation Ulcer Cleansing: Rinsed/Irrigated with Saline Topical Anesthetic Applied: Other: lidocaine 4%, Electronic Signature(s) Signed: 06/29/2017 2:49:42 PM By: Alejandro MullingPinkerton, Debra Entered By: Alejandro MullingPinkerton, Debra on 06/29/2017 14:05:06 [name] NeighborsGUTHRIE, Kendre  ([id number]) -------------------------------------------------------------------------------- Vitals Details Patient Name: [name] NeighborsGUTHRIE, Loxley Date of Service: 06/29/2017 1:30 PM Medical Record Number: [id number] Patient Account Number: [id number] Date of Birth/Sex: 06/28/1981 (36 y.o. Male) Treating RN: Phillis HaggisPinkerton, Debi Primary Care Christen Bedoya: Tarri AbernethyABINOWITZ, JOSEPH Other Clinician: Referring Anthonio Mizzell: Tarri AbernethyABINOWITZ, JOSEPH Treating Toddrick Sanna/Extender: Linwood DibblesSTONE III, HOYT Weeks in Treatment: 0 Vital Signs Time Taken: 13:51 Temperature (F): 98.3 Height (in): 69 Pulse (bpm): 71 Source: Stated Respiratory Rate (breaths/min): 20 Weight (lbs): 190.3 Blood Pressure (mmHg): 136/82 Source: Stated Reference Range: 80 - 120 mg / dl Body Mass Index (BMI): 28.1 Electronic Signature(s) Signed: 06/29/2017 2:49:42 PM By:  Alejandro Mulling Entered By: Alejandro Mulling on 06/29/2017 13:53:37

## 2017-07-12 ENCOUNTER — Encounter: Payer: BLUE CROSS/BLUE SHIELD | Attending: Physician Assistant | Admitting: Physician Assistant

## 2017-07-12 DIAGNOSIS — L98496 Non-pressure chronic ulcer of skin of other sites with bone involvement without evidence of necrosis: Secondary | ICD-10-CM | POA: Diagnosis not present

## 2017-07-12 DIAGNOSIS — X58XXXD Exposure to other specified factors, subsequent encounter: Secondary | ICD-10-CM | POA: Diagnosis not present

## 2017-07-12 DIAGNOSIS — S61412A Laceration without foreign body of left hand, initial encounter: Secondary | ICD-10-CM | POA: Diagnosis present

## 2017-07-12 DIAGNOSIS — S41111D Laceration without foreign body of right upper arm, subsequent encounter: Secondary | ICD-10-CM | POA: Insufficient documentation

## 2017-07-20 ENCOUNTER — Encounter: Payer: BLUE CROSS/BLUE SHIELD | Admitting: Internal Medicine

## 2017-07-20 DIAGNOSIS — L98496 Non-pressure chronic ulcer of skin of other sites with bone involvement without evidence of necrosis: Secondary | ICD-10-CM | POA: Diagnosis not present

## 2017-07-23 NOTE — Progress Notes (Signed)
[name] [name], [name] [name] ([id number]) Visit Report for 07/20/2017 HPI Details Patient Name: [name] [name], [name] [name] Date of Service: 07/20/2017 3:15 PM Medical Record Number: [id number] Patient Account Number: [id number] Date of Birth/Sex: 06/07/1981 (36 y.o. Male) Treating RN: Curtis Sitesorthy, Joanna Primary Care Provider: Tarri AbernethyABINOWITZ, JOSEPH Other Clinician: Referring Provider: Tarri AbernethyABINOWITZ, JOSEPH Treating Provider/Extender: Maxwell CaulOBSON, Nadina Fomby G Weeks in Treatment: 3 History of Present Illness Associated Signs and Symptoms: Patient has no major medical problems HPI Description: 06/29/17 on evaluation today patient appears to be doing fairly well other than the fact that he has noted on this initial evaluation and laceration of the left hand in the dorsal web spaces between the second and third fingers. He showed me the initial picture and this was a machine shop tool which calls the laceration and it actually cut down and involve the bone itself. He really has no pain he tells me other than some type feelings other than when the wound is being cleansed such as what I was doing today. No fevers, chills, nausea, or vomiting noted at this time. He does tell me that due to the swelling and tightness he doesn't have full range of motion at this point as he's afraid of extending and worsening the wound with too much motion. He has been using Steri-Strips to help secure this somewhat. He also works as a IT sales professionalfirefighter. Fortunately he has no other major medical problems. Specifically no diabetes. 07/05/17; patient is here for follow-up of a laceration injury on the left hand and the dorsal web space between the second and third fingers. This happened about a month ago. It was sutured then shortly after the sutures were removed the patient then his finger back while dealing with his 36-year-old daughter. This reopened. He is been using silver alginate. He still has a small open area towards distal aspect of the original injury. This has  0.3 cm in depth but it does not probe to bone 07/12/17 on evaluation today patient's laceration over his left hand appears to be doing excellent. He has been tolerating the dressing changes without complication and notes that the Endoform is not even able to be packed in as much as it was in the past. Overall he is pleased with how things are progressing the good news is I am as well. No fevers, chills, nausea, or vomiting noted at this time. 07/20/17; the patient's laceration over his left hand has closed. His only complaint is some difficulty having full flexion of the finger. He arrives today with a fresh laceration on the right forearm he obtained at work as he is leaving to go to the clinic. He does not want to go to the emergency room stating that last time it costs 3000. Electronic Signature(s) Signed: 07/20/2017 5:01:55 PM By: Baltazar Najjarobson, Kimorah Ridolfi MD Entered By: Baltazar Najjarobson, Kiera Hussey on 07/20/2017 16:57:23 [name] [name], Khoa ([id number]) -------------------------------------------------------------------------------- Physical Exam Details Patient Name: [name] [name], [name] [name] Date of Service: 07/20/2017 3:15 PM Medical Record Number: [id number] Patient Account Number: [id number] Date of Birth/Sex: 12/24/1980 (36 y.o. Male) Treating RN: Curtis Sitesorthy, Joanna Primary Care Provider: Tarri AbernethyABINOWITZ, JOSEPH Other Clinician: Referring Provider: Tarri AbernethyABINOWITZ, JOSEPH Treating Provider/Extender: Maxwell CaulOBSON, Cassandre Oleksy G Weeks in Treatment: 3 Constitutional Sitting or standing Blood Pressure is within target range for patient.. Pulse regular and within target range for patient.Marland Kitchen. Respirations regular, non-labored and within target range.. Temperature is normal and within the target range for the patient.Marland Kitchen. appears in no distress. Notes wound exam; his original wound bed on the left hand has closed. There is no open area here. oHe has  a new linear area on the right forearm this is a full-thickness linear laceration. There is no  involvement of the muscle. The surface approximates well. Electronic Signature(s) Signed: 07/20/2017 5:01:55 PM By: Baltazar Najjarobson, Kryslyn Helbig MD Entered By: Baltazar Najjarobson, Saleah Rishel on 07/20/2017 16:58:34 [name] [name], Duvan ([id number]) -------------------------------------------------------------------------------- Physician Orders Details Patient Name: [name] [name], [name] [name] Date of Service: 07/20/2017 3:15 PM Medical Record Number: [id number] Patient Account Number: [id number] Date of Birth/Sex: 11/07/1980 (36 y.o. Male) Treating RN: Curtis Sitesorthy, Joanna Primary Care Provider: Tarri AbernethyABINOWITZ, JOSEPH Other Clinician: Referring Provider: Tarri AbernethyABINOWITZ, JOSEPH Treating Provider/Extender: Altamese CarolinaOBSON, Judith Campillo G Weeks in Treatment: 3 Verbal / Phone Orders: No Diagnosis Coding Wound Cleansing Wound #2 Right Forearm o Clean wound with Normal Saline. Primary Wound Dressing Wound #2 Right Forearm o Silvercel Non-Adherent o Other: - steri strips Secondary Dressing Wound #2 Right Forearm o Tegaderm Dressing Change Frequency Wound #2 Right Forearm o Change dressing every other day. Follow-up Appointments o Return Appointment in 1 week. Electronic Signature(s) Signed: 07/20/2017 5:01:45 PM By: Curtis Sitesorthy, Joanna Signed: 07/20/2017 5:01:55 PM By: Baltazar Najjarobson, Heba Ige MD Entered By: Curtis Sitesorthy, Joanna on 07/20/2017 16:12:21 [name] [name], [name] [name] ([id number]) -------------------------------------------------------------------------------- Problem List Details Patient Name: [name] [name], [name] [name] Date of Service: 07/20/2017 3:15 PM Medical Record Number: [id number] Patient Account Number: [id number] Date of Birth/Sex: 03/22/1981 (36 y.o. 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 S41.111D Laceration without foreign body of right upper arm, subsequent 07/20/2017 Yes encounter Inactive Problems Resolved Problems Electronic Signature(s) Signed: 07/20/2017 5:01:55 PM By: Baltazar Najjarobson, Rane Dumm MD Entered By: Baltazar Najjarobson, Judythe Postema on 07/20/2017 16:54:21 [name] [name], [name] [name] ([id number]) -------------------------------------------------------------------------------- Progress Note Details Patient Name: [name] [name], [name] [name] Date of Service: 07/20/2017 3:15 PM Medical Record Number: [id number] Patient Account Number: [id number] Date of Birth/Sex: 09/29/1980 (36 y.o. Male) Treating RN: Curtis Sitesorthy, Joanna Primary Care Provider: Tarri AbernethyABINOWITZ, JOSEPH Other Clinician: Referring Provider: Tarri AbernethyABINOWITZ, JOSEPH Treating Provider/Extender: Maxwell CaulOBSON, Naesha Buckalew G Weeks in Treatment: 3 Subjective History of Present Illness (HPI) The following HPI elements were documented for the patient's wound: Associated Signs and Symptoms: Patient has no major medical problems 06/29/17 on evaluation today patient appears to be doing fairly well other than the fact that he has noted on this initial evaluation and laceration of the left hand in the dorsal web spaces between the second and third fingers. He showed me the initial picture and this was a machine shop tool which calls the laceration and it actually cut down and involve the bone itself. He really has no pain he tells me other than some type feelings other than when the wound is being cleansed such as what I was doing today. No fevers, chills, nausea, or vomiting noted at this time. He does tell me that due to the swelling and tightness he doesn't have full range of motion at this point as he's afraid of extending and worsening the wound with too much motion. He has been using Steri-Strips to help  secure this somewhat. He also works as a IT sales professionalfirefighter. Fortunately he has no other major medical problems. Specifically no diabetes. 07/05/17; patient is here for follow-up of a laceration injury on the left hand and the dorsal web space between the second and third fingers. This happened about a month ago. It was sutured then  shortly after the sutures were removed the patient then his finger back while dealing with his 77-year-old daughter. This reopened. He is been using silver alginate. He still has a small open area towards distal aspect of the original injury. This has 0.3 cm in depth but it does not probe to bone 07/12/17 on evaluation today patient's laceration over his left hand appears to be doing excellent. He has been tolerating the dressing changes without complication and notes that the Endoform is not even able to be packed in as much as it was in the past. Overall he is pleased with how things are progressing the good news is I am as well. No fevers, chills, nausea, or vomiting noted at this time. 07/20/17; the patient's laceration over his left hand has closed. His only complaint is some difficulty having full flexion of the finger. He arrives today with a fresh laceration on the right forearm he obtained at work as he is leaving to go to the clinic. He does not want to go to the emergency room stating that last time it costs 3000. Objective Constitutional Sitting or standing Blood Pressure is within target range for patient.. Pulse regular and within target range for patient.Marland Kitchen Respirations regular, non-labored and within target range.. Temperature is normal and within the target range for the patient.Marland Kitchen appears in no distress. Vitals Time Taken: 3:47 PM, Height: 69 in, Weight: 190.3 lbs, BMI: 28.1, Temperature: 98.5 F, Pulse: 79 bpm, Respiratory Rate: 18 breaths/min, Blood Pressure: 121/73 mmHg. General Notes: wound exam; his original wound bed on the left hand has closed. There is  no open area here. He has a new linear area on the right forearm this is a full-thickness linear laceration. There is no involvement of the muscle. The surface [place], Gio ([id number]) approximates well. Integumentary (Hair, Skin) Wound #1 status is Healed - Epithelialized. Original cause of wound was Trauma. The wound is located on the Left Hand - Web Between 1st and 2nd Digit. The wound measures 0cm length x 0cm width x 0cm depth; 0cm^2 area and 0cm^3 volume. Wound #2 status is Open. Original cause of wound was Trauma. The wound is located on the Right Forearm. The wound measures 0.4cm length x 3.5cm width x 0.1cm depth; 1.1cm^2 area and 0.11cm^3 volume. There is Fat Layer (Subcutaneous Tissue) Exposed exposed. There is no tunneling or undermining noted. There is a large amount of sanguinous drainage noted. The wound margin is flat and intact. There is no granulation within the wound bed. There is no necrotic tissue within the wound bed. The periwound skin appearance did not exhibit: Callus, Crepitus, Excoriation, Induration, Rash, Scarring, Dry/Scaly, Maceration, Atrophie Blanche, Cyanosis, Ecchymosis, Hemosiderin Staining, Mottled, Pallor, Rubor, Erythema. The periwound has tenderness on palpation. 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 Primary Wound Dressing: Wound #2 Right Forearm: Silvercel Non-Adherent Other: - steri strips Secondary Dressing: Wound #2 Right Forearm: Tegaderm Dressing Change Frequency: Wound #2 Right Forearm: Change dressing every other day. Follow-up Appointments: Return Appointment in 1 week. #1 the patient did not  want to go to the emergency room to have this sutured. #2 Steri-Stripped the wound applied silver alginateand Tegaderm Soto, Eward ([id number]) #3will change the superficial dressing every other  day Electronic Signature(s) Signed: 07/20/2017 5:01:55 PM By: Baltazar Najjar MD Entered By: Baltazar Najjar on 07/20/2017 16:59:23 [name] Neighbors ([id number]) -------------------------------------------------------------------------------- SuperBill Details Patient Name: [name] Neighbors Date of Service: 07/20/2017 Medical Record Number: [id number] Patient Account Number: [id number] Date of Birth/Sex: 10-20-1980 (36 y.o. 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 S41.111D Laceration without foreign body of right upper arm, subsequent encounter Facility Procedures CPT4 Code: [id number] Description: 99213 - WOUND CARE VISIT-LEV 3 EST PT Modifier: Quantity: 1 Physician Procedures CPT4 Code: [id number] Description: 84166 - WC PHYS LEVEL 2 - EST PT ICD-10 Diagnosis Description S61.412A Laceration without foreign body of left hand, initial encou S41.111D Laceration without foreign body of right upper arm, subsequ Modifier: nter ent encounter Quantity: 1 Electronic Signature(s) Signed: 07/20/2017 5:01:55 PM By: Baltazar Najjar MD Entered By: Baltazar Najjar on 07/20/2017 16:59:49

## 2017-07-23 NOTE — Progress Notes (Signed)
[name], [name] ([id number]) Visit Report for 07/20/2017 Arrival Information Details Patient Name: [name], [name] Date of Service: 07/20/2017 3:15 PM Medical Record Number: [id number] Patient Account Number: [id number] Date of Birth/Sex: 04-Sep-1980 (36 y.o. Male) Treating RN: [name] [name] Primary Care [name] [name]: [name] [name] Other Clinician: Referring [name] [name]: [name] [name] Treating [name] [name]/Extender: [name] [name] in Treatment: 3 Visit Information History Since Last Visit Added or deleted any medications: No Patient Arrived: Ambulatory Any new allergies or adverse reactions: No Arrival Time: 15:47 Had a fall or experienced change in No Accompanied By: self activities of daily living that may affect Transfer Assistance: None risk of falls: Patient Identification Verified: Yes Signs or symptoms of abuse/neglect since last visito No Secondary Verification Process Completed: Yes Hospitalized since last visit: No Patient Requires Transmission-Based No Has Dressing in Place as Prescribed: Yes Precautions: Pain Present Now: No Patient Has Alerts: No Electronic Signature(s) Signed: 07/20/2017 5:01:45 PM By: [name] [name] Entered By: [name] [name] on 07/20/2017 15:47:21 [name] [name] ([id number]) -------------------------------------------------------------------------------- Clinic Level of Care Assessment Details Patient Name: [name] [name] Date of Service: 07/20/2017 3:15 PM Medical Record Number: [id number] Patient Account Number: [id number] Date of Birth/Sex: [birth year] (36 y.o. Male) Treating RN: [name] [name] Primary Care [name] [name]: [name] [name] Other Clinician: Referring [name] [name]: [name] [name] Treating [name] [name]/Extender: [name] [place] in Treatment: 3 Clinic Level of Care Assessment Items TOOL 4 Quantity Score []  - Use when only an EandM is performed on FOLLOW-UP visit 0 ASSESSMENTS - Nursing Assessment / Reassessment X -  Reassessment of Co-morbidities (includes updates in patient status) 1 10 X- 1 5 Reassessment of Adherence to Treatment Plan ASSESSMENTS - Wound and Skin Assessment / Reassessment []  - Simple Wound Assessment / Reassessment - one wound 0 X- 2 5 Complex Wound Assessment / Reassessment - multiple wounds []  - 0 Dermatologic / Skin Assessment (not related to wound area) ASSESSMENTS - Focused Assessment []  - Circumferential Edema Measurements - multi extremities 0 []  - 0 Nutritional Assessment / Counseling / Intervention []  - 0 Lower Extremity Assessment (monofilament, tuning fork, pulses) []  - 0 Peripheral Arterial Disease Assessment (using hand held doppler) ASSESSMENTS - Ostomy and/or Continence Assessment and Care []  - Incontinence Assessment and Management 0 []  - 0 Ostomy Care Assessment and Management (repouching, etc.) PROCESS - Coordination of Care X - Simple Patient / Family Education for ongoing care 1 15 []  - 0 Complex (extensive) Patient / Family Education for ongoing care []  - 0 Staff obtains Chiropractor, Records, Test Results / Process Orders []  - 0 Staff telephones HHA, Nursing Homes / Clarify orders / etc []  - 0 Routine Transfer to another Facility (non-emergent condition) []  - 0 Routine Hospital Admission (non-emergent condition) []  - 0 New Admissions / Manufacturing engineer / Ordering NPWT, Apligraf, etc. []  - 0 Emergency Hospital Admission (emergent condition) X- 1 10 Simple Discharge Coordination [name], [name] ([id number]) []  - 0 Complex (extensive) Discharge Coordination PROCESS - Special Needs []  - Pediatric / Minor Patient Management 0 []  - 0 Isolation Patient Management []  - 0 Hearing / Language / Visual special needs []  - 0 Assessment of Community assistance (transportation, D/C planning, etc.) []  - 0 Additional assistance / Altered mentation []  - 0 Support Surface(s) Assessment (bed, cushion, seat, etc.) INTERVENTIONS - Wound Cleansing /  Measurement X - Simple Wound Cleansing - one wound 1 5 []  - 0 Complex Wound Cleansing - multiple wounds X- 1 5 Wound Imaging (photographs - any number of wounds) []  - 0 Wound Tracing (instead of photographs) X- 1  5 Simple Wound Measurement - one wound []  - 0 Complex Wound Measurement - multiple wounds INTERVENTIONS - Wound Dressings X - Small Wound Dressing one or multiple wounds 1 10 []  - 0 Medium Wound Dressing one or multiple wounds []  - 0 Large Wound Dressing one or multiple wounds []  - 0 Application of Medications - topical []  - 0 Application of Medications - injection INTERVENTIONS - Miscellaneous []  - External ear exam 0 []  - 0 Specimen Collection (cultures, biopsies, blood, body fluids, etc.) []  - 0 Specimen(s) / Culture(s) sent or taken to Lab for analysis []  - 0 Patient Transfer (multiple staff / Nurse, adult / Similar devices) []  - 0 Simple Staple / Suture removal (25 or less) []  - 0 Complex Staple / Suture removal (26 or more) []  - 0 Hypo / Hyperglycemic Management (close monitor of Blood Glucose) []  - 0 Ankle / Brachial Index (ABI) - do not check if billed separately X- 1 5 Vital Signs [name], [name] ([id number]) Has the patient been seen at the hospital within the last three years: Yes Total Score: 80 Level Of Care: New/Established - Level 3 Electronic Signature(s) Signed: 07/20/2017 5:01:45 PM By: [name] [name] Entered By: [name] [name] on 07/20/2017 16:34:36 [name] [name] ([id number]) -------------------------------------------------------------------------------- Encounter Discharge Information Details Patient Name: [name] [name] Date of Service: 07/20/2017 3:15 PM Medical Record Number: [id number] Patient Account Number: [id number] Date of Birth/Sex: 1980-10-04 (36 y.o. Male) Treating RN: [name] [name] Primary Care [name] [name]: [name] [name] Other Clinician: Referring [name] [name]: [name] [name] Treating [name] [name]/Extender: [name] [place] in Treatment: 3 Encounter Discharge Information Items Discharge Pain Level: 0 Discharge Condition: Stable Ambulatory Status: Ambulatory Discharge Destination: Home Transportation: Private Auto Accompanied By: self Schedule Follow-up Appointment: Yes Medication Reconciliation completed and No provided to Patient/Care Merced Hanners: Provided on Clinical Summary of Care: 07/20/2017 Form Type Recipient Paper Patient [name] Electronic Signature(s) Signed: 07/20/2017 4:35:39 PM By: [name] [name] Entered By: [name] [name] on 07/20/2017 16:35:39 [name] [name] ([id number]) -------------------------------------------------------------------------------- Multi Wound Chart Details Patient Name: [name] [name] Date of Service: 07/20/2017 3:15 PM Medical Record Number: [id number] Patient Account Number: [id number] Date of Birth/Sex: 1981-01-03 (36 y.o. Male) Treating RN: [name] [name] Primary Care Hetty Linhart: [name] [name] Other Clinician: Referring Malarie Tappen: [name] [name] Treating Mercades Bajaj/Extender: Maxwell Caul Weeks in Treatment: 3 Vital Signs Height(in): 69 Pulse(bpm): 79 Weight(lbs): 190.3 Blood Pressure(mmHg): 121/73 Body Mass Index(BMI): 28 Temperature(F): 98.5 Respiratory Rate 18 (breaths/min): Photos: [1:No Photos] [2:No Photos] [N/A:N/A] Wound Location: [1:Left Hand - Web Between 1st and 2nd Digit] [2:Right Forearm] [N/A:N/A] Wounding Event: [1:Trauma] [2:Trauma] [N/A:N/A] Primary Etiology: [1:Trauma, Other] [2:Trauma, Other] [N/A:N/A] Date Acquired: [1:06/02/2017] [2:07/20/2017] [N/A:N/A] Weeks of Treatment: [1:3] [2:0] [N/A:N/A] Wound Status: [1:Healed - Epithelialized] [2:Open] [N/A:N/A] Measurements L x W x D [1:0x0x0] [2:0.4x3.5x0.1] [N/A:N/A] (cm) Area (cm) : [1:0] [2:1.1] [N/A:N/A] Volume (cm) : [1:0] [2:0.11] [N/A:N/A] % Reduction in Area: [1:100.00%] [2:N/A] [N/A:N/A] % Reduction in Volume: [1:100.00%] [2:N/A] [N/A:N/A] Classification:  [1:Full Thickness With Exposed Support Structures] [2:Full Thickness With Exposed Support Structures] [N/A:N/A] Exudate Amount: [1:N/A] [2:Large] [N/A:N/A] Exudate Type: [1:N/A] [2:Sanguinous] [N/A:N/A] Exudate Color: [1:N/A] [2:red] [N/A:N/A] Wound Margin: [1:N/A] [2:Flat and Intact] [N/A:N/A] Granulation Amount: [1:N/A] [2:None Present (0%)] [N/A:N/A] Necrotic Amount: [1:N/A] [2:None Present (0%)] [N/A:N/A] Epithelialization: [1:N/A] [2:None] [N/A:N/A] Periwound Skin Texture: [1:No Abnormalities Noted] [2:Excoriation: No Induration: No Callus: No Crepitus: No Rash: No Scarring: No] [N/A:N/A] Periwound Skin Moisture: [1:No Abnormalities Noted] [2:Maceration: No Dry/Scaly: No] [N/A:N/A] Periwound Skin Color: [1:No Abnormalities Noted] [2:Atrophie Blanche: No Cyanosis: No Ecchymosis: No Erythema: No Hemosiderin Staining: No  Mottled: No] [N/A:N/A] Pallor: No Rubor: No Tenderness on Palpation: No Yes N/A Wound Preparation: N/A Ulcer Cleansing: N/A Rinsed/Irrigated with Saline Topical Anesthetic Applied: None Treatment Notes Electronic Signature(s) Signed: 07/20/2017 4:32:43 PM By: [name] Sitesorthy, Joanna Entered By: [name] Sitesorthy, Joanna on 07/20/2017 16:32:43 [name] NeighborsGUTHRIE, [name] ([id number]) -------------------------------------------------------------------------------- Multi-Disciplinary Care Plan Details Patient Name: [name] NeighborsGUTHRIE, [name] Date of Service: 07/20/2017 3:15 PM Medical Record Number: [id number] Patient Account Number: [id number] Date of Birth/Sex: 05/02/1981 [age] y.o. Male) Treating RN: [name] Sitesorthy, Joanna Primary Care Olon Russ: [name] AbernethyABINOWITZ, JOSEPH Other Clinician: Referring Braidyn Scorsone: [name] AbernethyABINOWITZ, JOSEPH Treating Khairi Garman/Extender: [name] CarolinaOBSON, MICHAEL G Weeks in Treatment: 3 Active Inactive ` Orientation to the Wound Care Program Nursing Diagnoses: Knowledge deficit related to the wound healing center program Goals: Patient/caregiver will verbalize understanding of the Wound Healing Center  Program Date Initiated: 06/29/2017 Target Resolution Date: 07/16/2017 Goal Status: Active Interventions: Provide education on orientation to the wound center Notes: ` Wound/Skin Impairment Nursing Diagnoses: Impaired tissue integrity Knowledge deficit related to ulceration/compromised skin integrity Goals: Ulcer/skin breakdown will have a volume reduction of 80% by week 12 Date Initiated: 06/29/2017 Target Resolution Date: 09/17/2017 Goal Status: Active Interventions: Assess patient/caregiver ability to perform ulcer/skin care regimen upon admission and as needed Assess ulceration(s) every visit Notes: Electronic Signature(s) Signed: 07/20/2017 4:32:36 PM By: [name] Sitesorthy, Joanna Entered By: [name] Sitesorthy, Joanna on 07/20/2017 16:32:36 [name] NeighborsGUTHRIE, Jung ([id number]) -------------------------------------------------------------------------------- Pain Assessment Details Patient Name: [name] NeighborsGUTHRIE, [name] Date of Service: 07/20/2017 3:15 PM Medical Record Number: [id number] Patient Account Number: [id number] Date of Birth/Sex: 08/12/1980 (36 y.o. Male) Treating RN: [name] Sitesorthy, Joanna Primary Care Christinamarie Tall: [name] AbernethyABINOWITZ, JOSEPH Other Clinician: Referring Jovane Foutz: [name] AbernethyABINOWITZ, JOSEPH Treating Renelle Stegenga/Extender: Maxwell CaulOBSON, MICHAEL G Weeks in Treatment: 3 Active Problems Location of Pain Severity and Description of Pain Patient Has Paino No Site Locations Pain Management and Medication Current Pain Management: Notes Topical or injectable lidocaine is offered to patient for acute pain when surgical debridement is performed. If needed, Patient is instructed to use over the counter pain medication for the following 24-48 hours after debridement. Wound care MDs do not prescribed pain medications. Patient has chronic pain or uncontrolled pain. Patient has been instructed to make an appointment with their Primary Care Physician for pain management. Electronic Signature(s) Signed: 07/20/2017 5:01:45 PM By: [name] Sitesorthy,  Joanna Entered By: [name] Sitesorthy, Joanna on 07/20/2017 15:47:29 [name] NeighborsGUTHRIE, [name] ([id number]) -------------------------------------------------------------------------------- Patient/Caregiver Education Details Patient Name: [name] NeighborsGUTHRIE, [name] [name] Date of Service: 07/20/2017 3:15 PM Medical Record Number: [id number] Patient Account Number: [id number] Date of Birth/Gender: 05/07/1981 (36 y.o. Male) Treating RN: [name] Sitesorthy, Joanna Primary Care Physician: [name] AbernethyABINOWITZ, JOSEPH Other Clinician: Referring Physician: Tarri AbernethyABINOWITZ, JOSEPH Treating Physician/Extender: [name] CarolinaOBSON, MICHAEL G Weeks in Treatment: 3 Education Assessment Education Provided To: Patient Education Topics Provided Wound/Skin Impairment: Handouts: Other: wound care as ordered Methods: Demonstration, Explain/Verbal Responses: State content correctly Electronic Signature(s) Signed: 07/20/2017 5:01:45 PM By: [name] Sitesorthy, Joanna Entered By: [name] Sitesorthy, Joanna on 07/20/2017 16:36:02 [name] NeighborsGUTHRIE, [name] [name] ([id number]) -------------------------------------------------------------------------------- Wound Assessment Details Patient Name: [name] NeighborsGUTHRIE, [name] Date of Service: 07/20/2017 3:15 PM Medical Record Number: [id number] Patient Account Number: [id number] Date of Birth/Sex: 07/27/1981 (36 y.o. Male) Treating RN: [name] Sitesorthy, Joanna Primary Care Jamaris Theard: [name] AbernethyABINOWITZ, JOSEPH Other Clinician: Referring Thelonious Kauffmann: [name] AbernethyABINOWITZ, JOSEPH Treating Kenniya Westrich/Extender: Maxwell CaulOBSON, MICHAEL G Weeks in Treatment: 3 Wound Status Wound Number: 1 Primary Etiology: Trauma, Other Wound Location: Left Hand - Web Between 1st and 2nd Wound Status: Healed - Epithelialized Digit Wounding Event: Trauma Date Acquired: 06/02/2017 Weeks Of Treatment: 3 Clustered Wound: No Wound Measurements Length: (cm) 0 Width: (cm) 0 Depth: (cm) 0 Area: (cm) 0 Volume: (cm) 0 % Reduction in Area:  100% % Reduction in Volume: 100% Wound Description Full Thickness With Exposed  Support Classification: Structures Periwound Skin Texture Texture Color No Abnormalities Noted: No No Abnormalities Noted: No Moisture No Abnormalities Noted: No Electronic Signature(s) Signed: 07/20/2017 5:01:45 PM By: [name] Sitesorthy, Joanna Entered By: [name] Sitesorthy, Joanna on 07/20/2017 16:07:27 [name] NeighborsGUTHRIE, [name] ([id number]) -------------------------------------------------------------------------------- Wound Assessment Details Patient Name: [name] NeighborsGUTHRIE, [name] Date of Service: 07/20/2017 3:15 PM Medical Record Number: [id number] Patient Account Number: [id number] Date of Birth/Sex: 08/22/1980 [age] y.o. Male) Treating RN: [name] Sitesorthy, Joanna Primary Care Jhamir Pickup: [name] AbernethyABINOWITZ, JOSEPH Other Clinician: Referring Lemoyne Scarpati: [name] AbernethyABINOWITZ, JOSEPH Treating Felise [place]/Extender: Maxwell CaulOBSON, MICHAEL G Weeks in Treatment: 3 Wound Status Wound Number: 2 Primary Etiology: Trauma, Other Wound Location: Right Forearm Wound Status: Open Wounding Event: Trauma Date Acquired: 07/20/2017 Weeks Of Treatment: 0 Clustered Wound: No Wound Measurements Length: (cm) 0.4 Width: (cm) 3.5 Depth: (cm) 0.1 Area: (cm) 1.1 Volume: (cm) 0.11 % Reduction in Area: % Reduction in Volume: Epithelialization: None Tunneling: No Undermining: No Wound Description Full Thickness With Exposed Support Classification: Structures Wound Margin: Flat and Intact Exudate Large Amount: Exudate Type: Sanguinous Exudate Color: red Foul Odor After Cleansing: No Slough/Fibrino No Wound Bed Granulation Amount: None Present (0%) Exposed Structure Necrotic Amount: None Present (0%) Fascia Exposed: No Fat Layer (Subcutaneous Tissue) Exposed: Yes Tendon Exposed: No Muscle Exposed: No Joint Exposed: No Bone Exposed: No Periwound Skin Texture Texture Color No Abnormalities Noted: No No Abnormalities Noted: No Callus: No Atrophie Blanche: No Crepitus: No Cyanosis: No Excoriation: No Ecchymosis: No Induration: No Erythema: No Rash:  No Hemosiderin Staining: No Scarring: No Mottled: No Pallor: No Moisture Rubor: No No Abnormalities Noted: No Dry / Scaly: No Temperature / Pain Disbrow, [name] [name] ([id number]) Maceration: No Tenderness on Palpation: Yes Wound Preparation Ulcer Cleansing: Rinsed/Irrigated with Saline Topical Anesthetic Applied: None Treatment Notes Wound #2 (Right Forearm) 1. Cleansed with: Clean wound with Normal Saline 3. Peri-wound Care: Skin Prep 4. Dressing Applied: Other dressing (specify in notes) 5. Secondary Dressing Applied Tegaderm Notes steri strips, silvercel Electronic Signature(s) Signed: 07/20/2017 5:01:45 PM By: [name] Sitesorthy, Joanna Entered By: [name] Sitesorthy, Joanna on 07/20/2017 16:07:18 [name] NeighborsGUTHRIE, Vic ([id number]) -------------------------------------------------------------------------------- Vitals Details Patient Name: [name] NeighborsGUTHRIE, Lavaughn Date of Service: 07/20/2017 3:15 PM Medical Record Number: [id number] Patient Account Number: [id number] Date of Birth/Sex: 12/02/1980 (36 y.o. Male) Treating RN: [name] Sitesorthy, Joanna Primary Care Sharad Vaneaton: [name] AbernethyABINOWITZ, JOSEPH Other Clinician: Referring Derrious Bologna: [name] AbernethyABINOWITZ, JOSEPH Treating Ashtin Rosner/Extender: [name] CarolinaOBSON, MICHAEL G Weeks in Treatment: 3 Vital Signs Time Taken: 15:47 Temperature (F): 98.5 Height (in): 69 Pulse (bpm): 79 Weight (lbs): 190.3 Respiratory Rate (breaths/min): 18 Body Mass Index (BMI): 28.1 Blood Pressure (mmHg): 121/73 Reference Range: 80 - 120 mg / dl Electronic Signature(s) Signed: 07/20/2017 5:01:45 PM By: [name] Sitesorthy, Joanna Entered By: [name] Sitesorthy, Joanna on 07/20/2017 15:53:37

## 2017-07-27 ENCOUNTER — Encounter: Payer: BLUE CROSS/BLUE SHIELD | Admitting: Internal Medicine

## 2017-07-27 DIAGNOSIS — L98496 Non-pressure chronic ulcer of skin of other sites with bone involvement without evidence of necrosis: Secondary | ICD-10-CM | POA: Diagnosis not present

## 2017-07-31 NOTE — Progress Notes (Signed)
[name] [name], Abdulrahim ([id number]) Visit Report for 07/27/2017 HPI Details Patient Name: [name] [name], [name] [name] Date of Service: 07/27/2017 3:15 PM Medical Record Number: [id number] Patient Account Number: [id number] Date of Birth/Sex: 07/28/1981 (36 y.o. Male) Treating RN: Curtis Sitesorthy, Joanna Primary Care Provider: Tarri AbernethyABINOWITZ, JOSEPH Other Clinician: Referring Provider: Tarri AbernethyABINOWITZ, JOSEPH Treating Provider/Extender: Maxwell CaulOBSON, MICHAEL G Weeks in Treatment: 4 History of Present Illness Associated Signs and Symptoms: Patient has no major medical problems HPI Description: 06/29/17 on evaluation today patient appears to be doing fairly well other than the fact that he has noted on this initial evaluation and laceration of the left hand in the dorsal web spaces between the second and third fingers. He showed me the initial picture and this was a machine shop tool which calls the laceration and it actually cut down and involve the bone itself. He really has no pain he tells me other than some type feelings other than when the wound is being cleansed such as what I was doing today. No fevers, chills, nausea, or vomiting noted at this time. He does tell me that due to the swelling and tightness he doesn't have full range of motion at this point as he's afraid of extending and worsening the wound with too much motion. He has been using Steri-Strips to help secure this somewhat. He also works as a IT sales professionalfirefighter. Fortunately he has no other major medical problems. Specifically no diabetes. 07/05/17; patient is here for follow-up of a laceration injury on the left hand and the dorsal web space between the second and third fingers. This happened about a month ago. It was sutured then shortly after the sutures were removed the patient then his finger back while dealing with his 36-year-old daughter. This reopened. He is been using silver alginate. He still has a small open area towards distal aspect of the original injury. This has  0.3 cm in depth but it does not probe to bone 07/12/17 on evaluation today patient's laceration over his left hand appears to be doing excellent. He has been tolerating the dressing changes without complication and notes that the Endoform is not even able to be packed in as much as it was in the past. Overall he is pleased with how things are progressing the good news is I am as well. No fevers, chills, nausea, or vomiting noted at this time. 07/20/17; the patient's laceration over his left hand has closed. His only complaint is some difficulty having full flexion of the finger. He arrives today with a fresh laceration on the right forearm he obtained at work as he is leaving to go to the clinic. He does not want to go to the emergency room stating that last time it costs 3000. 07/27/17; the patient's wound on his left hand is closed and he appears to have a better range of motion. The laceration injury on the right forearm from last week that we Steri-Stripped after the patient refused to go to the ER also was closed Electronic Signature(s) Signed: 07/31/2017 7:28:22 AM By: Baltazar Najjarobson, Michael MD Entered By: Baltazar Najjarobson, Michael on 07/27/2017 16:01:20 [name] [name], [name] [name] ([id number]) -------------------------------------------------------------------------------- Physical Exam Details Patient Name: [name] [name], [name] [name] Date of Service: 07/27/2017 3:15 PM Medical Record Number: [id number] Patient Account Number: [id number] Date of Birth/Sex: 07/11/1981 [age] y.o. Male) Treating RN: Curtis Sitesorthy, Joanna Primary Care Provider: Tarri AbernethyABINOWITZ, JOSEPH Other Clinician: Referring Provider: Tarri AbernethyABINOWITZ, JOSEPH Treating Provider/Extender: Maxwell CaulOBSON, MICHAEL G Weeks in Treatment: 4 Notes wound exam; his original wound on the left hand is closed he appears to have good range  of motion oLinear laceration from last week on the right forearm is fully apposed and looks healed to me. I've advised keeping this lubricated with Polysporin and  protected with a Band-Aid. Electronic Signature(s) Signed: 07/31/2017 7:28:22 AM By: Baltazar Najjar MD Entered By: Baltazar Najjar on 07/27/2017 16:02:30 [name] [name] ([id number]) -------------------------------------------------------------------------------- Physician Orders Details Patient Name: [name] [name] Date of Service: 07/27/2017 3:15 PM Medical Record Number: [id number] Patient Account Number: [id number] Date of Birth/Sex: August 11, 1980 (36 y.o. Male) Treating RN: Curtis Sites Primary Care Provider: Tarri Abernethy Other Clinician: Referring Provider: Tarri Abernethy Treating Provider/Extender: Altamese [place] in Treatment: 4 Verbal / Phone Orders: No Diagnosis Coding Discharge From [facility] o Discharge from Wound Care Center Electronic Signature(s) Signed: 07/27/2017 4:36:31 PM By: Curtis Sites Signed: 07/31/2017 7:28:22 AM By: Baltazar Najjar MD Entered By: Curtis Sites on 07/27/2017 15:38:36 [name] [name] ([id number]) -------------------------------------------------------------------------------- Problem List Details Patient Name: [name] [name] Date of Service: 07/27/2017 3:15 PM Medical Record Number: [id number] Patient Account Number: [id number] Date of Birth/Sex: December 06, 1980 (36 y.o. 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 Male) Treating RN: Curtis Sites Primary Care Provider: Tarri Abernethy Other Clinician: Referring Provider: Tarri Abernethy Treating Provider/Extender: Maxwell Caul Weeks in Treatment: 4 Subjective History of Present Illness (HPI) The following HPI elements were documented for the patient's wound: Associated Signs and Symptoms: Patient has no major medical problems 06/29/17 on evaluation today patient appears to be doing fairly well other than the fact that he has noted on this initial evaluation and laceration of the left hand in the dorsal web spaces between the second and third fingers. He showed me the initial picture and this was a machine shop tool which calls the laceration and it actually cut down and involve the bone itself. He really has no pain he tells me other than some type feelings other than when the wound is being cleansed such as what I was doing today. No fevers, chills, nausea, or vomiting noted at this time. He does tell me that due to the swelling and tightness he doesn't have full range of motion at this point as he's afraid of extending and worsening the wound with too much motion. He has been using Steri-Strips to help secure this somewhat. He also works as a IT sales professional. Fortunately he has no other major medical problems. Specifically no diabetes. 07/05/17; patient is here for follow-up of a laceration injury on the left hand and the dorsal web space between the second and third fingers. This happened about a month ago. It was sutured then shortly after the  sutures were removed the patient then his finger back while dealing with his 73-year-old daughter. This reopened. He is been using silver alginate. He still has a small open area towards distal aspect of the original injury.  This has 0.3 cm in depth but it does not probe to bone 07/12/17 on evaluation today patient's laceration over his left hand appears to be doing excellent. He has been tolerating the dressing changes without complication and notes that the Endoform is not even able to be packed in as much as it was in the past. Overall he is pleased with how things are progressing the good news is I am as well. No fevers, chills, nausea, or vomiting noted at this time. 07/20/17; the patient's laceration over his left hand has closed. His only complaint is some difficulty having full flexion of the finger. He arrives today with a fresh laceration on the right forearm he obtained at work as he is leaving to go to the clinic. He does not want to go to the emergency room stating that last time it costs 3000. 07/27/17; the patient's wound on his left hand is closed and he appears to have a better range of motion. The laceration injury on the right forearm from last week that we Steri-Stripped after the patient refused to go to the ER also was closed Objective Constitutional Vitals Time Taken: 3:22 PM, Height: 69 in, Weight: 190.3 lbs, BMI: 28.1, Temperature: 98.7 F, Pulse: 66 bpm, Respiratory Rate: 18 breaths/min, Blood Pressure: 122/66 mmHg. Integumentary (Hair, Skin) Wound #2 status is Healed - Epithelialized. Original cause of wound was Trauma. The wound is located on the Right Forearm. The wound measures 0cm length x 0cm width x 0cm depth; 0cm^2 area and 0cm^3 volume. 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 S41.111D - Laceration without foreign body of right upper arm, subsequent encounter [facility] Center #1 I think the patient can be discharged from the wound care center #2 his wounds were traumatic an accidental, there is no need for secondary prevention counseling Electronic Signature(s) Signed: 07/31/2017 7:28:22 AM By: Baltazar Najjarobson, Michael MD Entered By: Baltazar Najjarobson, Michael on 07/27/2017 16:02:56 [name] [name], [name] [name] ([id number]) -------------------------------------------------------------------------------- SuperBill Details Patient Name: [name] [name], [name] [name] Date of Service: 07/27/2017 Medical Record Number: [id number] Patient Account Number: [id number] Date of Birth/Sex: 11/23/1980 (36 y.o. 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 S41.111D Laceration without foreign body of right upper arm, subsequent encounter Facility Procedures CPT4 Code: [id number] Description: [phone] - WOUND CARE VISIT-LEV 2 EST PT Modifier: Quantity: 1 Physician Procedures CPT4 Code: [id number] Description: [id number] - WC PHYS LEVEL 2 - EST PT ICD-10 Diagnosis Description S61.412A Laceration without foreign body of left hand, initial encou S41.111D Laceration without foreign body of right upper arm, subsequ Modifier: nter ent encounter Quantity: 1 Electronic Signature(s) Signed: 07/27/2017 4:20:11 PM By: Curtis Sitesorthy, Joanna Signed: 07/31/2017 7:28:22 AM By: Baltazar Najjarobson, Michael MD Entered By: Curtis Sitesorthy, Joanna on 07/27/2017 16:20:11

## 2017-07-31 NOTE — Progress Notes (Signed)
[name] NeighborsGUTHRIE, Dequane ([id number]) Visit Report for 07/27/2017 Arrival Information Details Patient Name: [name] NeighborsGUTHRIE, Shoichi Date of Service: 07/27/2017 3:15 PM Medical Record Number: [id number] Patient Account Number: [id number] Date of Birth/Sex: 12/11/1980 (36 y.o. Male) Treating RN: Curtis Sitesorthy, Joanna Primary Care Latrecia Capito: Tarri AbernethyABINOWITZ, JOSEPH Other Clinician: Referring Carlota Philley: Tarri AbernethyABINOWITZ, JOSEPH Treating Kyrielle Urbanski/Extender: Altamese CarolinaOBSON, MICHAEL G Weeks in Treatment: 4 Visit Information History Since Last Visit Added or deleted any medications: No Patient Arrived: Ambulatory Any new allergies or adverse reactions: No Arrival Time: 15:21 Had a fall or experienced change in No Accompanied By: self activities of daily living that may affect Transfer Assistance: None risk of falls: Patient Identification Verified: Yes Signs or symptoms of abuse/neglect since last visito No Secondary Verification Process Completed: Yes Hospitalized since last visit: No Patient Requires Transmission-Based No Has Dressing in Place as Prescribed: Yes Precautions: Pain Present Now: No Patient Has Alerts: No Electronic Signature(s) Signed: 07/27/2017 4:36:31 PM By: Curtis Sitesorthy, Joanna Entered By: Curtis Sitesorthy, Joanna on 07/27/2017 15:21:42 [name] NeighborsGUTHRIE, Scottie ([id number]) -------------------------------------------------------------------------------- Clinic Level of Care Assessment Details Patient Name: [name] NeighborsGUTHRIE, Shaquel Date of Service: 07/27/2017 3:15 PM Medical Record Number: [id number] Patient Account Number: [id number] Date of Birth/Sex: 07/06/1981 (36 y.o. Male) Treating RN: Curtis Sitesorthy, Joanna Primary Care Sherlynn Tourville: Tarri AbernethyABINOWITZ, JOSEPH Other Clinician: Referring Anayla Giannetti: Tarri AbernethyABINOWITZ, JOSEPH Treating Jaz Laningham/Extender: Altamese CarolinaOBSON, MICHAEL G Weeks in Treatment: 4 Clinic Level of Care Assessment Items TOOL 4 Quantity Score []  - Use when only an EandM is performed on FOLLOW-UP visit 0 ASSESSMENTS - Nursing Assessment / Reassessment X -  Reassessment of Co-morbidities (includes updates in patient status) 1 10 X- 1 5 Reassessment of Adherence to Treatment Plan ASSESSMENTS - Wound and Skin Assessment / Reassessment X - Simple Wound Assessment / Reassessment - one wound 1 5 []  - 0 Complex Wound Assessment / Reassessment - multiple wounds []  - 0 Dermatologic / Skin Assessment (not related to wound area) ASSESSMENTS - Focused Assessment []  - Circumferential Edema Measurements - multi extremities 0 []  - 0 Nutritional Assessment / Counseling / Intervention []  - 0 Lower Extremity Assessment (monofilament, tuning fork, pulses) []  - 0 Peripheral Arterial Disease Assessment (using hand held doppler) ASSESSMENTS - Ostomy and/or Continence Assessment and Care []  - Incontinence Assessment and Management 0 []  - 0 Ostomy Care Assessment and Management (repouching, etc.) PROCESS - Coordination of Care X - Simple Patient / Family Education for ongoing care 1 15 []  - 0 Complex (extensive) Patient / Family Education for ongoing care []  - 0 Staff obtains ChiropractorConsents, Records, Test Results / Process Orders []  - 0 Staff telephones HHA, Nursing Homes / Clarify orders / etc []  - 0 Routine Transfer to another Facility (non-emergent condition) []  - 0 Routine Hospital Admission (non-emergent condition) []  - 0 New Admissions / Manufacturing engineernsurance Authorizations / Ordering NPWT, Apligraf, etc. []  - 0 Emergency Hospital Admission (emergent condition) X- 1 10 Simple Discharge Coordination [name] NeighborsGUTHRIE, Kalub ([id number]) []  - 0 Complex (extensive) Discharge Coordination PROCESS - Special Needs []  - Pediatric / Minor Patient Management 0 []  - 0 Isolation Patient Management []  - 0 Hearing / Language / Visual special needs []  - 0 Assessment of Community assistance (transportation, D/C planning, etc.) []  - 0 Additional assistance / Altered mentation []  - 0 Support Surface(s) Assessment (bed, cushion, seat, etc.) INTERVENTIONS - Wound Cleansing /  Measurement X - Simple Wound Cleansing - one wound 1 5 []  - 0 Complex Wound Cleansing - multiple wounds X- 1 5 Wound Imaging (photographs - any number of wounds) []  - 0 Wound Tracing (instead of photographs) X-  1 5 Simple Wound Measurement - one wound []  - 0 Complex Wound Measurement - multiple wounds INTERVENTIONS - Wound Dressings []  - Small Wound Dressing one or multiple wounds 0 []  - 0 Medium Wound Dressing one or multiple wounds []  - 0 Large Wound Dressing one or multiple wounds []  - 0 Application of Medications - topical []  - 0 Application of Medications - injection INTERVENTIONS - Miscellaneous []  - External ear exam 0 []  - 0 Specimen Collection (cultures, biopsies, blood, body fluids, etc.) []  - 0 Specimen(s) / Culture(s) sent or taken to Lab for analysis []  - 0 Patient Transfer (multiple staff / Nurse, adult / Similar devices) []  - 0 Simple Staple / Suture removal (25 or less) []  - 0 Complex Staple / Suture removal (26 or more) []  - 0 Hypo / Hyperglycemic Management (close monitor of Blood Glucose) []  - 0 Ankle / Brachial Index (ABI) - do not check if billed separately X- 1 5 Vital Signs Petrosyan, Kedarius ([id number]) Has the patient been seen at the hospital within the last three years: Yes Total Score: 65 Level Of Care: New/Established - Level 2 Electronic Signature(s) Signed: 07/27/2017 4:36:31 PM By: Curtis Sites Entered By: Curtis Sites on 07/27/2017 16:20:04 [name] [name] ([id number]) -------------------------------------------------------------------------------- Encounter Discharge Information Details Patient Name: [name] [name] Date of Service: 07/27/2017 3:15 PM Medical Record Number: [id number] Patient Account Number: [id number] Date of Birth/Sex: 1980-11-09 (36 y.o. Male) Treating RN: Curtis Sites Primary Care Zephan Beauchaine: Tarri Abernethy Other Clinician: Referring Remus Hagedorn: Tarri Abernethy Treating Desarie Feild/Extender: Altamese [place] in Treatment: 4 Encounter Discharge Information Items Discharge Pain Level: 0 Discharge Condition: Stable Ambulatory Status: Ambulatory Discharge Destination: Home Transportation: Private Auto Accompanied By: self Schedule Follow-up Appointment: No Medication Reconciliation completed and No provided to Patient/Care Dylin Breeden: Provided on Clinical Summary of Care: 07/27/2017 Form Type Recipient Paper Patient JG Electronic Signature(s) Signed: 07/27/2017 4:20:28 PM By: Curtis Sites Entered By: Curtis Sites on 07/27/2017 16:20:28 [name] [name] ([id number]) -------------------------------------------------------------------------------- Multi Wound Chart Details Patient Name: [name] [name] Date of Service: 07/27/2017 3:15 PM Medical Record Number: [id number] Patient Account Number: [id number] Date of Birth/Sex: 10-Nov-1980 (36 y.o. Male) Treating RN: Curtis Sites Primary Care Jashawn Floyd: Tarri Abernethy Other Clinician: Referring Britne Borelli: Tarri Abernethy Treating Sahid Borba/Extender: Maxwell Caul Weeks in Treatment: 4 Vital Signs Height(in): 69 Pulse(bpm): 66 Weight(lbs): 190.3 Blood Pressure(mmHg): 122/66 Body Mass Index(BMI): 28 Temperature(F): 98.7 Respiratory Rate 18 (breaths/min): Photos: [2:No Photos] [N/A:N/A] Wound Location: [2:Right Forearm] [N/A:N/A] Wounding Event: [2:Trauma] [N/A:N/A] Primary Etiology: [2:Trauma, Other] [N/A:N/A] Date Acquired: [2:07/20/2017] [N/A:N/A] Weeks of Treatment: [2:1] [N/A:N/A] Wound Status: [2:Healed - Epithelialized] [N/A:N/A] Measurements L x W x D [2:0x0x0] [N/A:N/A] (cm) Area (cm) : [2:0] [N/A:N/A] Volume (cm) : [2:0] [N/A:N/A] % Reduction in Area: [2:100.00%] [N/A:N/A] % Reduction in Volume: [2:100.00%] [N/A:N/A] Classification: [2:Full Thickness With Exposed Support Structures] [N/A:N/A] Periwound Skin Texture: [2:No Abnormalities Noted] [N/A:N/A] Periwound Skin Moisture: [2:No Abnormalities  Noted] [N/A:N/A] Periwound Skin Color: [2:No Abnormalities Noted No] [N/A:N/A N/A] Treatment Notes Electronic Signature(s) Signed: 07/31/2017 7:28:22 AM By: Baltazar Najjar MD Entered By: Baltazar Najjar on 07/27/2017 16:00:33 [name] [name] ([id number]) -------------------------------------------------------------------------------- Multi-Disciplinary Care Plan Details Patient Name: [name] [name] Date of Service: 07/27/2017 3:15 PM Medical Record Number: [id number] Patient Account Number: [id number] Date of Birth/Sex: [birth year] (36 y.o. Male) Treating RN: Curtis Sites Primary Care Damaria Stofko: Tarri Abernethy Other Clinician: Referring Markas Aldredge: Tarri Abernethy Treating Sinaya Minogue/Extender: Maxwell Caul Weeks in Treatment: 4 Active Inactive Electronic Signature(s) Signed: 07/27/2017 4:36:31 PM By: Curtis Sites Entered By: Curtis Sites on 07/27/2017  15:38:23 [name] NeighborsGUTHRIE, Johncharles ([id number]) -------------------------------------------------------------------------------- Pain Assessment Details Patient Name: [name] NeighborsGUTHRIE, Keilon Date of Service: 07/27/2017 3:15 PM Medical Record Number: [id number] Patient Account Number: [id number] Date of Birth/Sex: 06/14/1981 (36 y.o. Male) Treating RN: Curtis Sitesorthy, Joanna Primary Care Lasharon Dunivan: Tarri AbernethyABINOWITZ, JOSEPH Other Clinician: Referring Carlyle Achenbach: Tarri AbernethyABINOWITZ, JOSEPH Treating Cristino Degroff/Extender: Maxwell CaulOBSON, MICHAEL G Weeks in Treatment: 4 Active Problems Location of Pain Severity and Description of Pain Patient Has Paino No Site Locations Pain Management and Medication Current Pain Management: Electronic Signature(s) Signed: 07/27/2017 4:36:31 PM By: Curtis Sitesorthy, Joanna Entered By: Curtis Sitesorthy, Joanna on 07/27/2017 15:22:08 [name] NeighborsGUTHRIE, Parthiv ([id number]) -------------------------------------------------------------------------------- Patient/Caregiver Education Details Patient Name: [name] NeighborsGUTHRIE, Zander Date of Service: 07/27/2017 3:15 PM Medical Record Number:  [id number] Patient Account Number: [id number] Date of Birth/Gender: 07/22/1981 (36 y.o. Male) Treating RN: Curtis Sitesorthy, Joanna Primary Care Physician: Tarri AbernethyABINOWITZ, JOSEPH Other Clinician: Referring Physician: Tarri AbernethyABINOWITZ, JOSEPH Treating Physician/Extender: Altamese CarolinaOBSON, MICHAEL G Weeks in Treatment: 4 Education Assessment Education Provided To: Patient Education Topics Provided Basic Hygiene: Handouts: Other: care of newly healed ulcer site Methods: Explain/Verbal Responses: State content correctly Electronic Signature(s) Signed: 07/27/2017 4:36:31 PM By: Curtis Sitesorthy, Joanna Entered By: Curtis Sitesorthy, Joanna on 07/27/2017 16:20:46 [name] NeighborsGUTHRIE, Johncarlo ([id number]) -------------------------------------------------------------------------------- Wound Assessment Details Patient Name: [name] NeighborsGUTHRIE, Julia Date of Service: 07/27/2017 3:15 PM Medical Record Number: [id number] Patient Account Number: [id number] Date of Birth/Sex: 08/02/1981 (36 y.o. Male) Treating RN: Curtis Sitesorthy, Joanna Primary Care Willard Madrigal: Tarri AbernethyABINOWITZ, JOSEPH Other Clinician: Referring Berlyn Malina: Tarri AbernethyABINOWITZ, JOSEPH Treating Bernadett Milian/Extender: Maxwell CaulOBSON, MICHAEL G Weeks in Treatment: 4 Wound Status Wound Number: 2 Primary Etiology: Trauma, Other Wound Location: Right Forearm Wound Status: Healed - Epithelialized Wounding Event: Trauma Date Acquired: 07/20/2017 Weeks Of Treatment: 1 Clustered Wound: No Photos Photo Uploaded By: Curtis Sitesorthy, Joanna on 07/27/2017 16:23:30 Wound Measurements Length: (cm) 0 Width: (cm) 0 Depth: (cm) 0 Area: (cm) 0 Volume: (cm) 0 % Reduction in Area: 100% % Reduction in Volume: 100% Wound Description Full Thickness With Exposed Support Classification: Structures Periwound Skin Texture Texture Color No Abnormalities Noted: No No Abnormalities Noted: No Moisture No Abnormalities Noted: No Electronic Signature(s) Signed: 07/27/2017 4:36:31 PM By: Curtis Sitesorthy, Joanna Entered By: Curtis Sitesorthy, Joanna on 07/27/2017  15:38:11 [name] NeighborsGUTHRIE, Aadil ([id number]) -------------------------------------------------------------------------------- Vitals Details Patient Name: [name] NeighborsGUTHRIE, Gianmarco Date of Service: 07/27/2017 3:15 PM Medical Record Number: [id number] Patient Account Number: [id number] Date of Birth/Sex: 09/24/1980 (36 y.o. Male) Treating RN: Curtis Sitesorthy, Joanna Primary Care Sandon Yoho: Tarri AbernethyABINOWITZ, JOSEPH Other Clinician: Referring Nathalia Wismer: Tarri AbernethyABINOWITZ, JOSEPH Treating Lummie Montijo/Extender: Maxwell CaulOBSON, MICHAEL G Weeks in Treatment: 4 Vital Signs Time Taken: 15:22 Temperature (F): 98.7 Height (in): 69 Pulse (bpm): 66 Weight (lbs): 190.3 Respiratory Rate (breaths/min): 18 Body Mass Index (BMI): 28.1 Blood Pressure (mmHg): 122/66 Reference Range: 80 - 120 mg / dl Electronic Signature(s) Signed: 07/27/2017 4:36:31 PM By: Curtis Sitesorthy, Joanna Entered By: Curtis Sitesorthy, Joanna on 07/27/2017 15:24:31

## 2018-12-16 IMAGING — DX DG HAND COMPLETE 3+V*L*
3 series · 3 of 3 positions shown · non-contrast
Comparison: None.

CLINICAL DATA: Left hand laceration

EXAM:
LEFT HAND - COMPLETE 3+ VIEW

[hand ap]
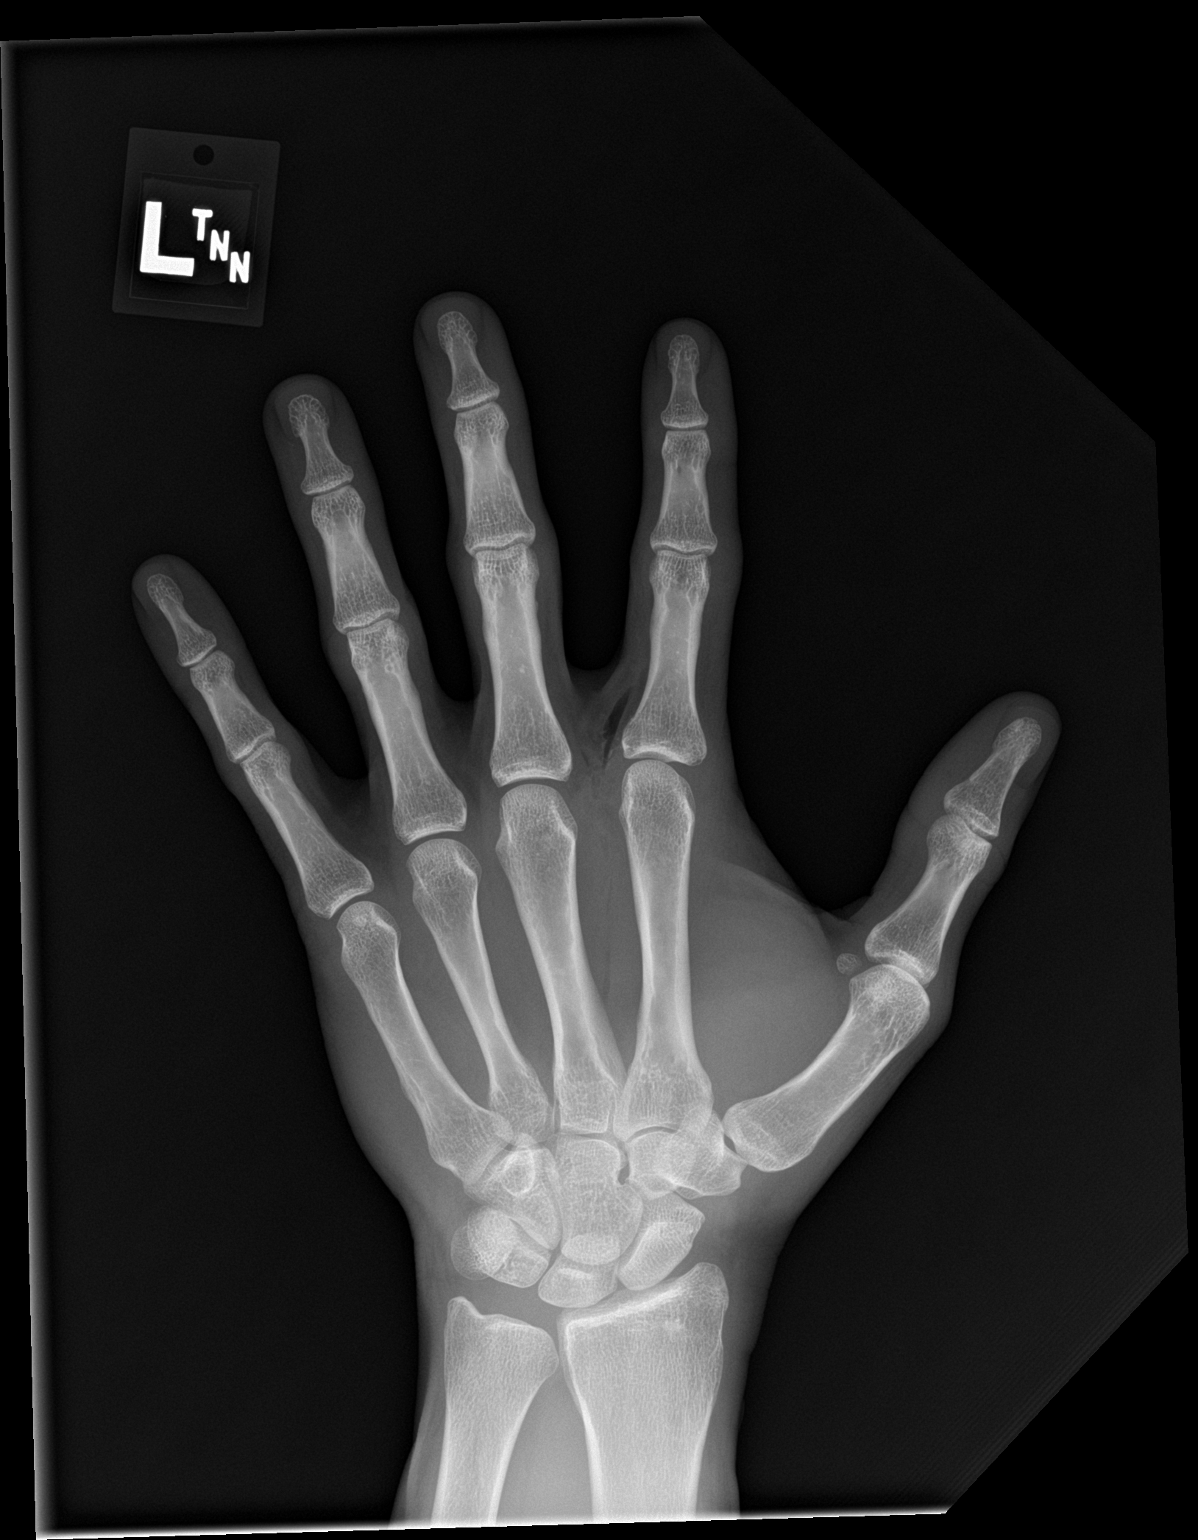

[hand obl]
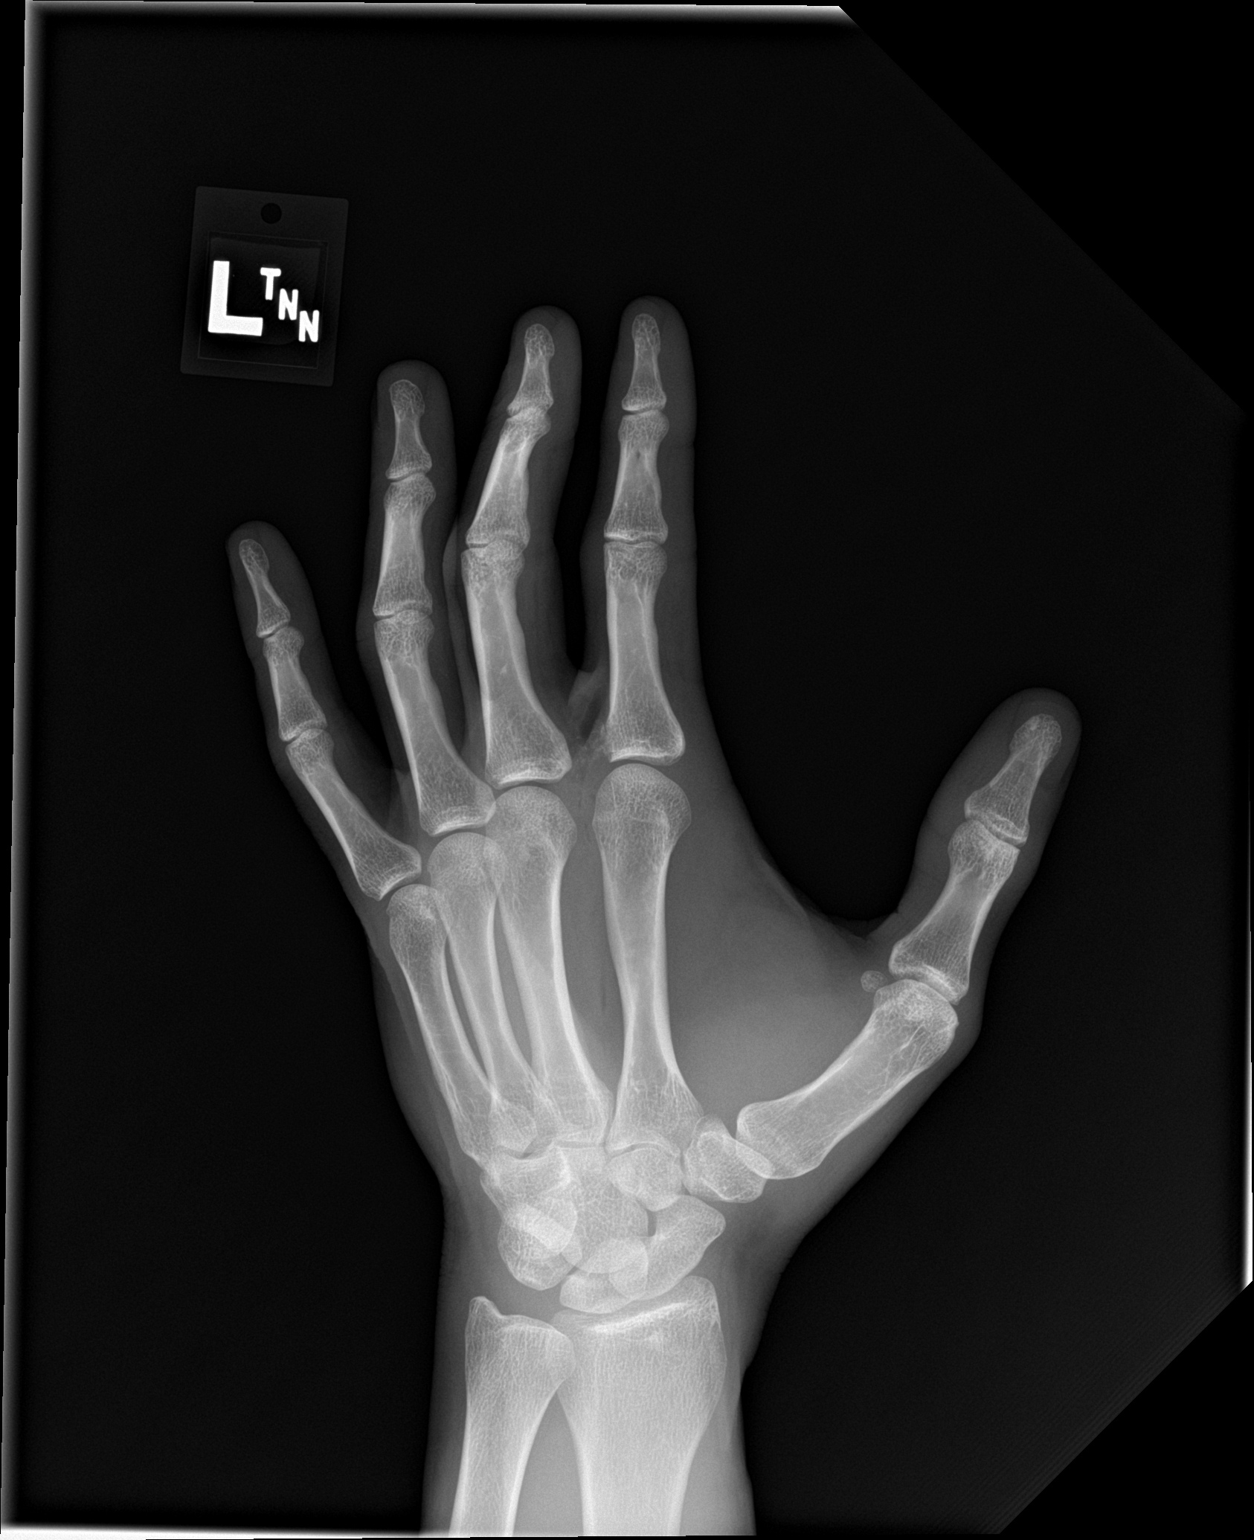

[hand lat]
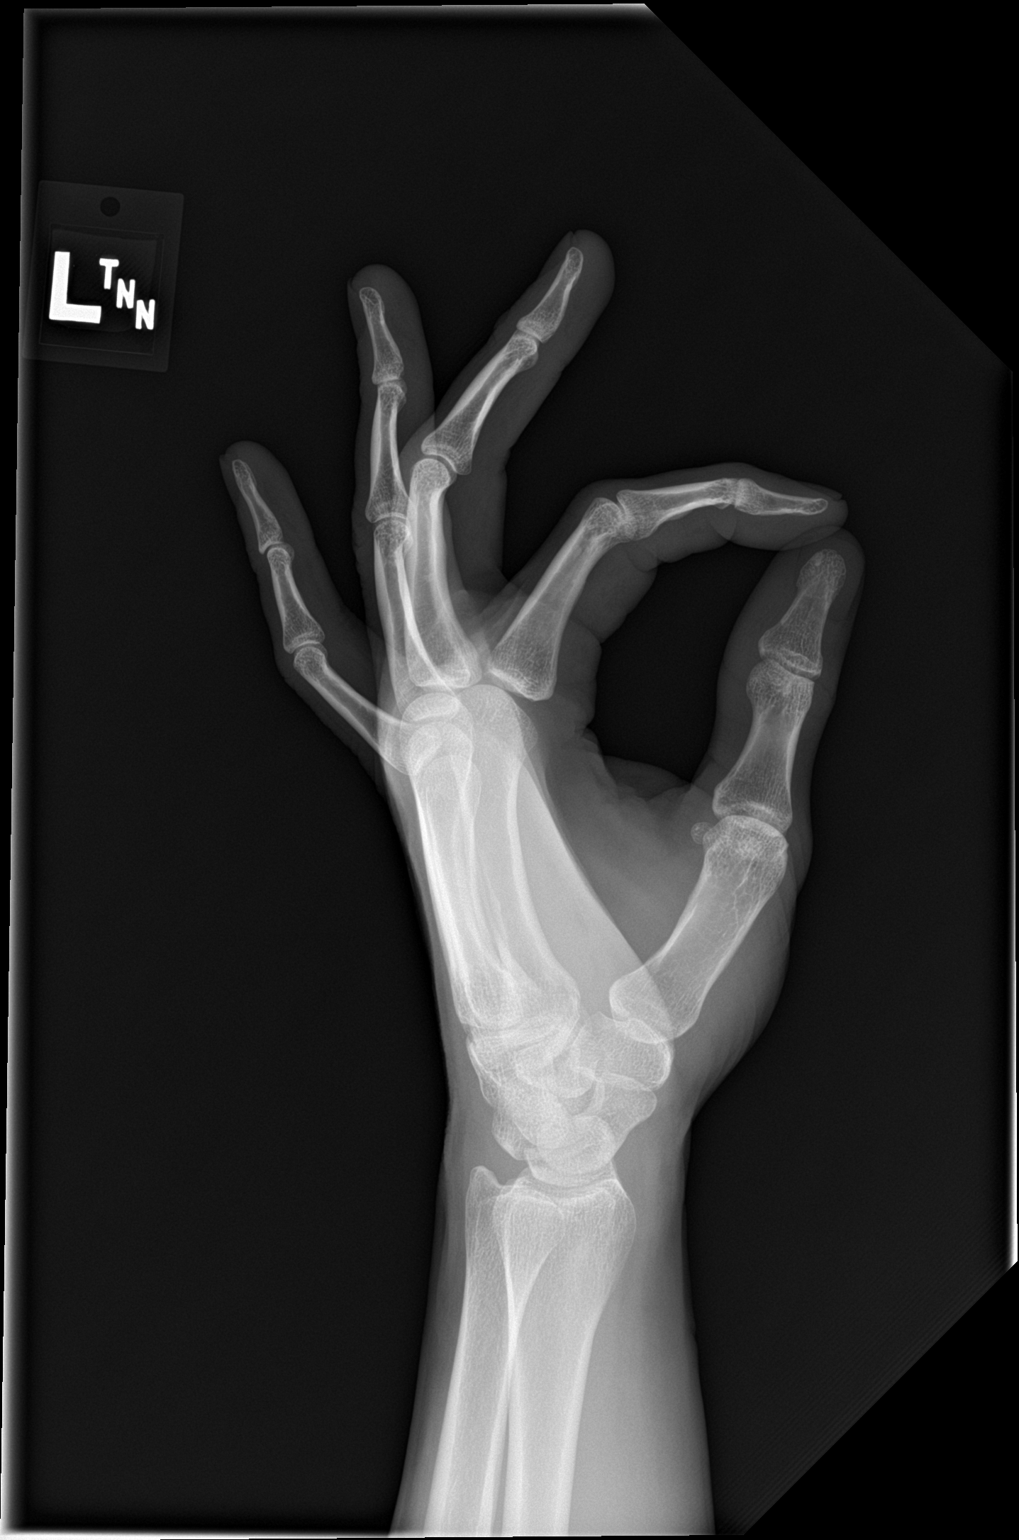

[3 of 3 positions shown; findings below may reference images not displayed]

FINDINGS: Soft tissue laceration between the second and third digits. Slightly
comminuted cortical fracture involving the base of the second
proximal phalanx with articular involvement. Multiple indistinct
bone fragments within the soft tissue web between the second and
third digits.
IMPRESSION: Soft tissue laceration between the second and third digits with
evidence for comminuted fracture at the base of the second proximal
phalanx with multiple displaced bone fragments.

## 2019-07-02 HISTORY — PX: VASECTOMY: SHX75

## 2019-07-30 ENCOUNTER — Other Ambulatory Visit: Payer: Self-pay

## 2019-07-30 ENCOUNTER — Encounter: Payer: Self-pay | Admitting: Occupational Medicine

## 2019-07-30 ENCOUNTER — Ambulatory Visit: Payer: 59 | Admitting: Occupational Medicine

## 2019-07-30 VITALS — BP 138/86 | HR 75 | Temp 98.7°F | Resp 12 | Ht 70.0 in | Wt 188.0 lb

## 2019-07-30 DIAGNOSIS — Z0000 Encounter for general adult medical examination without abnormal findings: Secondary | ICD-10-CM

## 2019-07-30 LAB — POCT URINALYSIS DIPSTICK
Bilirubin, UA: NEGATIVE
Blood, UA: NEGATIVE
Glucose, UA: NEGATIVE
Ketones, UA: NEGATIVE
Leukocytes, UA: NEGATIVE
Nitrite, UA: NEGATIVE
Protein, UA: NEGATIVE
Spec Grav, UA: >=1.03 — AB (ref 1.010–1.025)
Urobilinogen, UA: 0.2 E.U./dL
pH, UA: 5.5 (ref 5.0–8.0)

## 2019-07-30 NOTE — Progress Notes (Signed)
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  Labs drawn today, results pending.  Tdap updated.  Patient interested in returning to clinic for blood work: testosterone and blood type/screen.  Darlin Priestly, MHS, PA-C 07/30/2019

## 2019-07-31 LAB — CMP12+LP+TP+TSH+6AC+PSA+CBC?
AST: 24 IU/L (ref 0–40)
Albumin/Globulin Ratio: 2.1 (ref 1.2–2.2)
Albumin: 5 g/dL (ref 4.0–5.0)
BUN/Creatinine Ratio: 22 — ABNORMAL HIGH (ref 9–20)
BUN: 23 mg/dL — ABNORMAL HIGH (ref 6–20)
Basos: 1 %
Bilirubin Total: 0.3 mg/dL (ref 0.0–1.2)
Chloride: 102 mmol/L (ref 96–106)
Chol/HDL Ratio: 2.9 ratio (ref 0.0–5.0)
Cholesterol, Total: 179 mg/dL (ref 100–199)
Creatinine, Ser: 1.04 mg/dL (ref 0.76–1.27)
Estimated CHD Risk: <0.5 times avg. (ref 0.0–1.0)
Globulin, Total: 2.4 g/dL (ref 1.5–4.5)
Glucose: 103 mg/dL — ABNORMAL HIGH (ref 65–99)
HDL: 62 mg/dL (ref >39)
Hematocrit: 48.8 % (ref 37.5–51.0)
Immature Grans (Abs): 0 10*3/uL (ref 0.0–0.1)
Immature Granulocytes: 0 %
Iron: 105 ug/dL (ref 38–169)
LDH: 189 IU/L (ref 121–224)
Lymphocytes Absolute: 1.8 10*3/uL (ref 0.7–3.1)
MCHC: 34.6 g/dL (ref 31.5–35.7)
Monocytes Absolute: 0.4 10*3/uL (ref 0.1–0.9)
Neutrophils Absolute: 2.2 10*3/uL (ref 1.4–7.0)
Neutrophils: 47 %
Phosphorus: 4.3 mg/dL — ABNORMAL HIGH (ref 2.8–4.1)
RBC: 5.54 x10E6/uL (ref 4.14–5.80)
RDW: 12.9 % (ref 11.6–15.4)
Sodium: 141 mmol/L (ref 134–144)
T3 Uptake Ratio: 28 % (ref 24–39)
Triglycerides: 65 mg/dL (ref 0–149)
Uric Acid: 5.9 mg/dL (ref 3.8–8.4)
VLDL Cholesterol Cal: 12 mg/dL (ref 5–40)
WBC: 4.5 10*3/uL (ref 3.4–10.8)

## 2019-07-31 LAB — CMP12+LP+TP+TSH+6AC+PSA+CBC…
ALT: 24 IU/L (ref 0–44)
Alkaline Phosphatase: 80 IU/L (ref 39–117)
Basophils Absolute: 0 10*3/uL (ref 0.0–0.2)
Calcium: 10 mg/dL (ref 8.7–10.2)
EOS (ABSOLUTE): 0.1 10*3/uL (ref 0.0–0.4)
Eos: 2 %
Free Thyroxine Index: 1.3 (ref 1.2–4.9)
GFR calc Af Amer: 105 mL/min/{1.73_m2} (ref >59)
GFR calc non Af Amer: 91 mL/min/{1.73_m2} (ref >59)
GGT: 14 IU/L (ref 0–65)
Hemoglobin: 16.9 g/dL (ref 13.0–17.7)
LDL Chol Calc (NIH): 105 mg/dL — ABNORMAL HIGH (ref 0–99)
Lymphs: 40 %
MCH: 30.5 pg (ref 26.6–33.0)
MCV: 88 fL (ref 79–97)
Monocytes: 10 %
Platelets: 255 10*3/uL (ref 150–450)
Potassium: 4 mmol/L (ref 3.5–5.2)
Prostate Specific Ag, Serum: 0.6 ng/mL (ref 0.0–4.0)
T4, Total: 4.7 ug/dL (ref 4.5–12.0)
TSH: 3.42 u[IU]/mL (ref 0.450–4.500)
Total Protein: 7.4 g/dL (ref 6.0–8.5)

## 2019-08-01 LAB — ABO/RH: Rh Factor: POSITIVE

## 2019-08-07 ENCOUNTER — Ambulatory Visit: Payer: 59

## 2019-08-16 ENCOUNTER — Other Ambulatory Visit: Payer: Self-pay

## 2019-08-16 ENCOUNTER — Ambulatory Visit: Payer: 59

## 2019-08-16 DIAGNOSIS — E291 Testicular hypofunction: Secondary | ICD-10-CM

## 2019-08-16 NOTE — Progress Notes (Signed)
S/P Vasectomy 06/2019.  Presents with [facility] requisition from  Dr. Anola Gurney for follow-up testosterone level.  AMD

## 2019-08-17 LAB — TESTOSTERONE: Testosterone: 491 ng/dL (ref 264–916)

## 2019-11-12 ENCOUNTER — Emergency Department
Admission: EM | Admit: 2019-11-12 | Discharge: 2019-11-12 | Disposition: A | Discharge status: Home / Self Care | Payer: No Typology Code available for payment source | Attending: Emergency Medicine | Admitting: Emergency Medicine

## 2019-11-12 ENCOUNTER — Other Ambulatory Visit: Payer: Self-pay

## 2019-11-12 ENCOUNTER — Encounter: Payer: Self-pay | Admitting: Emergency Medicine

## 2019-11-12 DIAGNOSIS — S0181XA Laceration without foreign body of other part of head, initial encounter: Secondary | ICD-10-CM

## 2019-11-12 DIAGNOSIS — Y929 Unspecified place or not applicable: Secondary | ICD-10-CM | POA: Diagnosis not present

## 2019-11-12 DIAGNOSIS — W208XXA Other cause of strike by thrown, projected or falling object, initial encounter: Secondary | ICD-10-CM | POA: Insufficient documentation

## 2019-11-12 DIAGNOSIS — S01412A Laceration without foreign body of left cheek and temporomandibular area, initial encounter: Secondary | ICD-10-CM | POA: Insufficient documentation

## 2019-11-12 DIAGNOSIS — Y990 Civilian activity done for income or pay: Secondary | ICD-10-CM | POA: Diagnosis not present

## 2019-11-12 DIAGNOSIS — Y9389 Activity, other specified: Secondary | ICD-10-CM | POA: Diagnosis not present

## 2019-11-12 MED ORDER — BACITRACIN-NEOMYCIN-POLYMYXIN 400-5-5000 EX OINT
TOPICAL_OINTMENT | Freq: Once | CUTANEOUS | Status: AC
  Administered 2019-11-12: 1 via TOPICAL
  Filled 2019-11-12: qty 1

## 2019-11-12 MED ORDER — TRAMADOL HCL 50 MG PO TABS: 50.0000 mg | ORAL_TABLET | Freq: Four times a day (QID) | ORAL | 0 refills | Status: DC | PRN

## 2019-11-12 MED ORDER — LIDOCAINE HCL (PF) 1 % IJ SOLN
5.0000 mL | Freq: Once | INTRAMUSCULAR | Status: AC
  Administered 2019-11-12: 20:00:00 5 mL via INTRADERMAL
  Filled 2019-11-12: qty 5

## 2019-11-12 NOTE — ED Notes (Signed)
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 [phone].

## 2019-11-12 NOTE — ED Provider Notes (Signed)
[facility] Emergency Department Provider Note  ____________________________________________  Time seen: Approximately 7:01 PM  I have reviewed the triage vital signs and the nursing notes.   HISTORY  Chief Complaint Laceration   HPI [name] [name] is a 39 y.o. male who presents to the emergency department and evaluation of facial laceration. While at work, a tool swung back and hit the left side of his face.  He denies loss of consciousness.  No alleviating measures attempted prior to arrival.  Tdap is current.  Past Medical History:  Diagnosis Date  . Migraines   . Patient denies medical problems     Patient Active Problem List   Diagnosis Date Noted  . Fracture of phalanx of finger 06/06/2017    Past Surgical History:  Procedure Laterality Date  . VASECTOMY  07/02/2019    Prior to Admission medications   Medication Sig Start Date End Date Taking? Authorizing Provider  traMADol (ULTRAM) 50 MG tablet Take 1 tablet (50 mg total) by mouth every 6 (six) hours as needed. 11/12/19   Victorino Dike, FNP    Allergies Patient has no known allergies.  Family History  Problem Relation Age of Onset  . Breast cancer Mother        08/11/2016    Social History Social History   Tobacco Use  . Smoking status: Never Smoker  . Smokeless tobacco: Never Used  Substance Use Topics  . Alcohol use: Yes  . Drug use: No    Review of Systems  Constitutional: Negative for fever. Respiratory: Negative for cough or shortness of breath.  Musculoskeletal: Negative for myalgias Skin: Positive for laceration to the left side of his face. Neurological: Negative for numbness or paresthesias. ____________________________________________   PHYSICAL EXAM:  VITAL SIGNS: ED Triage Vitals  Enc Vitals Group     BP 11/12/19 1737 (!) 159/71     Pulse Rate 11/12/19 1737 75     Resp 11/12/19 1737 18     Temp 11/12/19 1737 98.4 F (36.9 C)     Temp Source  11/12/19 1737 Oral     SpO2 11/12/19 1737 99 %     Weight 11/12/19 1737 180 lb (81.6 kg)     Height 11/12/19 1737 5\' 9"  (1.753 m)     Head Circumference --      Peak Flow --      Pain Score 11/12/19 1743 0     Pain Loc --      Pain Edu? --      Excl. in [place]? --      Constitutional: Well appearing. Eyes: Conjunctivae are clear without discharge or drainage. Nose: No rhinorrhea noted. No epistaxis. Mouth/Throat: Airway is patent. No malocclusion.  Neck: No stridor. Unrestricted range of motion observed. Cardiovascular: Capillary refill is <3 seconds.  Respiratory: Respirations are even and unlabored.. Musculoskeletal: Unrestricted range of motion observed. Neurologic: Awake, alert, and oriented x 4.  Skin:  3.5 cm laceration to the left cheek.   ____________________________________________   LABS (all labs ordered are listed, but only abnormal results are displayed)  Labs Reviewed - No data to display ____________________________________________  EKG  Not indicated. ____________________________________________  RADIOLOGY  Not indicated. ____________________________________________   PROCEDURES  .Marland KitchenLaceration Repair  Date/Time: 11/12/2019 7:48 PM Performed by: Victorino Dike, FNP Authorized by: Victorino Dike, FNP   Consent:    Consent obtained:  Verbal   Consent given by:  Patient Anesthesia (see MAR for exact dosages):    Anesthesia method:  Local infiltration   Local anesthetic:  Lidocaine 1% w/o epi Laceration details:    Location:  Face   Face location:  L cheek   Length (cm):  3.5 Repair type:    Repair type:  Intermediate Pre-procedure details:    Preparation:  Patient was prepped and draped in usual sterile fashion Exploration:    Hemostasis achieved with:  Direct pressure   Wound extent: no foreign bodies/material noted   Treatment:    Area cleansed with:  Betadine and saline   Amount of cleaning:  Standard Subcutaneous repair:    Suture  size:  6-0   Suture material:  Vicryl   Suture technique:  Running   Number of sutures:  4 Skin repair:    Repair method:  Sutures   Suture size:  6-0   Suture material:  Prolene   Suture technique:  Simple interrupted   Number of sutures:  5 Approximation:    Approximation:  Close Post-procedure details:    Dressing:  Antibiotic ointment and adhesive bandage   Patient tolerance of procedure:  Tolerated well, no immediate complications   ____________________________________________   INITIAL IMPRESSION / ASSESSMENT AND PLAN / ED COURSE  [name] [name] is a 39 y.o. male presenting to the emergency department after sustaining a laceration to the left side of his face while at work.  See HPI for further details.  Laceration was repaired as detailed above.  Patient was given wound care instructions.  He will have the county nurse remove the sutures in approximately 5 days.  He was encouraged to see primary care or return to the emergency department for symptoms of concern.   Medications  neomycin-bacitracin-polymyxin (NEOSPORIN) ointment packet (has no administration in time range)  lidocaine (PF) (XYLOCAINE) 1 % injection 5 mL (5 mLs Intradermal Given 11/12/19 1942)     Pertinent labs & imaging results that were available during my care of the patient were reviewed by me and considered in my medical decision making (see chart for details).  ____________________________________________   FINAL CLINICAL IMPRESSION(S) / ED DIAGNOSES  Final diagnoses:  Facial laceration, initial encounter    ED Discharge Orders         Ordered    traMADol (ULTRAM) 50 MG tablet  Every 6 hours PRN     11/12/19 1936           Note:  This document was prepared using Dragon voice recognition software and may include unintentional dictation errors.   Chinita Pester, FNP 11/12/19 1950    Phineas Semen, MD 11/12/19 2039

## 2019-11-12 NOTE — Discharge Instructions (Signed)
Please have the sutures removed in 5 days.  Use antibiotic ointment over the area 2 times per day.  Keep the wound clean and dry for 24 hours.  Afterward, you may wash with soap and water.  Leave open to air when no chance of getting dirty or wet.

## 2019-11-12 NOTE — ED Triage Notes (Signed)
Pt here for laceration to left cheek.  A tool at work came back and hit him. No LOC.  Ambulatory. Works for [employer], is Geologist, engineering.

## 2019-11-12 NOTE — ED Notes (Signed)
Pt roomed, walked with no issues, gauze covering lac to left side of face. No active bleeding noted. Oriented to room and call bell.

## 2019-11-14 ENCOUNTER — Encounter: Payer: Self-pay | Admitting: Physician Assistant

## 2019-11-14 ENCOUNTER — Other Ambulatory Visit: Payer: Self-pay

## 2019-11-14 ENCOUNTER — Ambulatory Visit: Payer: Self-pay | Admitting: Physician Assistant

## 2019-11-14 VITALS — BP 140/70 | HR 57 | Temp 98.4°F | Resp 12

## 2019-11-14 DIAGNOSIS — S0181XD Laceration without foreign body of other part of head, subsequent encounter: Secondary | ICD-10-CM

## 2019-11-14 NOTE — Progress Notes (Signed)
   Subjective: Facial laceration    Patient ID: [name] [name], male    DOB: 1980-10-16, 39 y.o.   MRN: [id number]  HPI Patient presents with reevaluation facial laceration from ER visit 2 days ago.  Patient voices no complaints.  Requests return to work evaluation.   Review of Systems Negative except for complaint.    Objective:   Physical Exam Patient presents with a 3.5 cm laceration to the left cheek.  Area is sutured.  No signs or symptoms of secondary infection.  Mild guarding with palpation.       Assessment & Plan: Healing facial laceration  Patient given discharge care instructions.  Patient advised to wear a Band-Aid over laceration when wearing protective masks at work.  Patient advised to have sutures removed in 3 days.

## 2019-11-14 NOTE — Progress Notes (Signed)
DOI:  11/12/19 [facility] ED visit Facial laceration with sutures (external & internal)  ED told him he can get sutures out on Saturday (11/17/19).  Their work note put him out of work until today.   Next scheduled to work tomorrow & Sunday.  Rx for Tramadol from the ED.  AMD

## 2019-11-17 ENCOUNTER — Emergency Department
Admission: EM | Admit: 2019-11-17 | Discharge: 2019-11-17 | Disposition: A | Discharge status: Home / Self Care | Payer: 59 | Attending: Emergency Medicine | Admitting: Emergency Medicine

## 2019-11-17 ENCOUNTER — Encounter: Payer: Self-pay | Admitting: Emergency Medicine

## 2019-11-17 ENCOUNTER — Other Ambulatory Visit: Payer: Self-pay

## 2019-11-17 DIAGNOSIS — Z4802 Encounter for removal of sutures: Secondary | ICD-10-CM | POA: Insufficient documentation

## 2019-11-17 DIAGNOSIS — Y990 Civilian activity done for income or pay: Secondary | ICD-10-CM | POA: Diagnosis not present

## 2019-11-17 NOTE — ED Triage Notes (Signed)
Pt presents to ED for suture removal from L cheek. Pt states had stitches placed on 4/5.

## 2019-11-17 NOTE — ED Provider Notes (Signed)
[facility] Emergency Department Provider Note   ____________________________________________   First MD Initiated Contact with Patient 11/17/19 1446     (approximate)  I have reviewed the triage vital signs and the nursing notes.   HISTORY  Chief Complaint Suture / Staple Removal    HPI [name] [name] is a 39 y.o. male patient presents for suture removal status post facial laceration 5 days ago.  Voices no complaints.         Past Medical History:  Diagnosis Date  . Migraines   . Patient denies medical problems     Patient Active Problem List   Diagnosis Date Noted  . Fracture of phalanx of finger 06/06/2017    Past Surgical History:  Procedure Laterality Date  . VASECTOMY  07/02/2019    Prior to Admission medications   Medication Sig Start Date End Date Taking? Authorizing Provider  traMADol (ULTRAM) 50 MG tablet Take 1 tablet (50 mg total) by mouth every 6 (six) hours as needed. 11/12/19   Chinita Pester, FNP    Allergies Patient has no known allergies.  Family History  Problem Relation Age of Onset  . Breast cancer Mother        08/11/2016    Social History Social History   Tobacco Use  . Smoking status: Never Smoker  . Smokeless tobacco: Never Used  Substance Use Topics  . Alcohol use: Yes  . Drug use: No    Review of Systems Constitutional: No fever/chills Eyes: No visual changes. ENT: No sore throat. Cardiovascular: Denies chest pain. Respiratory: Denies shortness of breath. Gastrointestinal: No abdominal pain.  No nausea, no vomiting.  No diarrhea.  No constipation. Genitourinary: Negative for dysuria. Musculoskeletal: Negative for back pain. Skin: Negative for rash.  Facial laceration Neurological: Negative for headaches, focal weakness or numbness.   ____________________________________________   PHYSICAL EXAM:  VITAL SIGNS: ED Triage Vitals [11/17/19 1437]  Enc Vitals Group     BP (!) 146/66     Pulse Rate 68     Resp 18     Temp 98.4 F (36.9 C)     Temp Source Oral     SpO2 100 %     Weight 180 lb (81.6 kg)     Height 5\' 9"  (1.753 m)     Head Circumference      Peak Flow      Pain Score 0     Pain Loc      Pain Edu?      Excl. in GC?     Constitutional: Alert and oriented. Well appearing and in no acute distress. Cardiovascular: Normal rate, regular rhythm. Grossly normal heart sounds.  Good peripheral circulation. Respiratory: Normal respiratory effort.  No retractions. Lungs CTAB. Skin:  Skin is warm, dry and intact. No rash noted.  Healed facial laceration Psychiatric: Mood and affect are normal. Speech and behavior are normal.  ____________________________________________   LABS (all labs ordered are listed, but only abnormal results are displayed)  Labs Reviewed - No data to display ____________________________________________  EKG   ____________________________________________  RADIOLOGY  ED MD interpretation:    Official radiology report(s): No results found.  ____________________________________________   PROCEDURES  Procedure(s) performed (including Critical Care):  .Suture Removal  Date/Time: 11/17/2019 3:16 PM Performed by: 01/17/2020, PA-C Authorized by: Joni Reining, PA-C   Consent:    Consent obtained:  Verbal   Consent given by:  Patient   Risks discussed:  Bleeding, pain and  wound separation Location:    Location:  Head/neck   Head/neck location:  Cheek Procedure details:    Wound appearance:  No signs of infection, good wound healing and clean   Number of sutures removed:  5 Post-procedure details:    Post-removal:  No dressing applied   Patient tolerance of procedure:  Tolerated well, no immediate complications     ____________________________________________   INITIAL IMPRESSION / ASSESSMENT AND PLAN / ED COURSE  As part of my medical decision making, I reviewed the following data within the [facility]     Patient presents with suture removal status post facial laceration 5 days ago.  See procedure note.  Patient given discharge care instructions.    [name] [name] was evaluated in Emergency Department on 11/17/2019 for the symptoms described in the history of present illness. He was evaluated in the context of the global COVID-19 pandemic, which necessitated consideration that the patient might be at risk for infection with the SARS-CoV-2 virus that causes COVID-19. Institutional protocols and algorithms that pertain to the evaluation of patients at risk for COVID-19 are in a state of rapid change based on information released by regulatory bodies including the CDC and federal and state organizations. These policies and algorithms were followed during the patient's care in the ED.       ____________________________________________   FINAL CLINICAL IMPRESSION(S) / ED DIAGNOSES  Final diagnoses:  Visit for suture removal     ED Discharge Orders    None       Note:  This document was prepared using Dragon voice recognition software and may include unintentional dictation errors.    Sable Feil, PA-C 11/17/19 1519    Delman Kitten, MD 11/17/19 1606

## 2020-02-29 ENCOUNTER — Ambulatory Visit: Payer: Self-pay

## 2020-02-29 ENCOUNTER — Other Ambulatory Visit: Payer: Self-pay

## 2020-02-29 DIAGNOSIS — Z0000 Encounter for general adult medical examination without abnormal findings: Secondary | ICD-10-CM

## 2020-02-29 LAB — POCT URINALYSIS DIPSTICK
Bilirubin, UA: NEGATIVE
Blood, UA: NEGATIVE
Glucose, UA: NEGATIVE
Ketones, UA: NEGATIVE
Leukocytes, UA: NEGATIVE
Nitrite, UA: NEGATIVE
Protein, UA: NEGATIVE
Spec Grav, UA: 1.025 (ref 1.010–1.025)
Urobilinogen, UA: 0.2 E.U./dL
pH, UA: 6 (ref 5.0–8.0)

## 2020-03-04 LAB — CMP12+LP+TP+TSH+6AC+CBC/D/PLT
ALT: 23 IU/L (ref 0–44)
AST: 27 IU/L (ref 0–40)
Albumin/Globulin Ratio: 1.8 (ref 1.2–2.2)
Albumin: 5.2 g/dL — ABNORMAL HIGH (ref 4.0–5.0)
Alkaline Phosphatase: 46 IU/L — ABNORMAL LOW (ref 48–121)
BUN/Creatinine Ratio: 14 (ref 9–20)
BUN: 16 mg/dL (ref 6–20)
Basophils Absolute: 0 10*3/uL (ref 0.0–0.2)
Basos: 1 %
Bilirubin Total: 0.5 mg/dL (ref 0.0–1.2)
Calcium: 9.8 mg/dL (ref 8.7–10.2)
Chloride: 102 mmol/L (ref 96–106)
Chol/HDL Ratio: 14.7 ratio — ABNORMAL HIGH (ref 0.0–5.0)
Cholesterol, Total: 294 mg/dL — ABNORMAL HIGH (ref 100–199)
Creatinine, Ser: 1.12 mg/dL (ref 0.76–1.27)
EOS (ABSOLUTE): 0 10*3/uL (ref 0.0–0.4)
Eos: 1 %
Estimated CHD Risk: 2.4 times avg. — ABNORMAL HIGH (ref 0.0–1.0)
Free Thyroxine Index: 1.3 (ref 1.2–4.9)
GFR calc Af Amer: 95 mL/min/{1.73_m2} (ref >59)
GFR calc non Af Amer: 82 mL/min/{1.73_m2} (ref >59)
GGT: 11 IU/L (ref 0–65)
Globulin, Total: 2.9 g/dL (ref 1.5–4.5)
Glucose: 100 mg/dL — ABNORMAL HIGH (ref 65–99)
HDL: 20 mg/dL — ABNORMAL LOW (ref >39)
Hematocrit: 44 % (ref 37.5–51.0)
Hemoglobin: 15.4 g/dL (ref 13.0–17.7)
Immature Grans (Abs): 0 10*3/uL (ref 0.0–0.1)
Immature Granulocytes: 0 %
Iron: 87 ug/dL (ref 38–169)
LDH: 191 IU/L (ref 121–224)
LDL Chol Calc (NIH): 256 mg/dL — ABNORMAL HIGH (ref 0–99)
Lymphocytes Absolute: 1.5 10*3/uL (ref 0.7–3.1)
Lymphs: 46 %
MCH: 29.6 pg (ref 26.6–33.0)
MCHC: 35 g/dL (ref 31.5–35.7)
MCV: 85 fL (ref 79–97)
Monocytes Absolute: 0.3 10*3/uL (ref 0.1–0.9)
Monocytes: 9 %
Neutrophils Absolute: 1.4 10*3/uL (ref 1.4–7.0)
Neutrophils: 43 %
Phosphorus: 3.4 mg/dL (ref 2.8–4.1)
Platelets: 332 10*3/uL (ref 150–450)
Potassium: 4.4 mmol/L (ref 3.5–5.2)
RBC: 5.21 x10E6/uL (ref 4.14–5.80)
RDW: 12.9 % (ref 11.6–15.4)
Sodium: 141 mmol/L (ref 134–144)
T3 Uptake Ratio: 31 % (ref 24–39)
T4, Total: 4.1 ug/dL — ABNORMAL LOW (ref 4.5–12.0)
TSH: 3.67 u[IU]/mL (ref 0.450–4.500)
Total Protein: 8.1 g/dL (ref 6.0–8.5)
Triglycerides: 100 mg/dL (ref 0–149)
Uric Acid: 5.9 mg/dL (ref 3.8–8.4)
VLDL Cholesterol Cal: 18 mg/dL (ref 5–40)
WBC: 3.2 10*3/uL — ABNORMAL LOW (ref 3.4–10.8)

## 2020-03-04 LAB — QUANTIFERON-TB GOLD PLUS
QuantiFERON Mitogen Value: >10 IU/mL
QuantiFERON Nil Value: 0 IU/mL
QuantiFERON TB1 Ag Value: 0 IU/mL
QuantiFERON TB2 Ag Value: 0 IU/mL
QuantiFERON-TB Gold Plus: NEGATIVE

## 2020-03-06 ENCOUNTER — Ambulatory Visit: Payer: Self-pay | Admitting: Emergency Medicine

## 2020-03-06 ENCOUNTER — Encounter: Payer: Self-pay | Admitting: Emergency Medicine

## 2020-03-06 ENCOUNTER — Other Ambulatory Visit: Payer: Self-pay

## 2020-03-06 VITALS — BP 137/91 | HR 59 | Temp 98.3°F | Resp 14 | Ht 66.0 in | Wt 182.0 lb

## 2020-03-06 DIAGNOSIS — Z0000 Encounter for general adult medical examination without abnormal findings: Secondary | ICD-10-CM

## 2020-03-06 NOTE — Progress Notes (Signed)
    I have reviewed the triage vital signs and the nursing notes.   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  He denies any problems at this time.       Past Medical History:  Diagnosis Date  . Migraines   . Patient denies medical problems     Patient Active Problem List   Diagnosis Date Noted  . Fracture of phalanx of finger 06/06/2017    Past Surgical History:  Procedure Laterality Date  . VASECTOMY  07/02/2019    Prior to Admission medications   Medication Sig Start Date End Date Taking? Authorizing Provider  traMADol (ULTRAM) 50 MG tablet Take 1 tablet (50 mg total) by mouth every 6 (six) hours as needed. 11/12/19   Chinita Pester, FNP    Allergies Patient has no known allergies.  Family History  Problem Relation Age of Onset  . Breast cancer Mother        08/11/2016    Social History Social History   Tobacco Use  . Smoking status: Never Smoker  . Smokeless tobacco: Never Used  Substance Use Topics  . Alcohol use: Yes  . Drug use: No    Review of Systems Constitutional: No fever/chills Eyes: No visual changes. ENT: No complaints Cardiovascular: Denies chest pain. Respiratory: Denies shortness of breath. Gastrointestinal: No abdominal pain.  Musculoskeletal: Negative for joint or muscle pain. Skin: Negative for rash. Neurological: Negative for headaches, focal weakness or numbness. _______________________________________   PHYSICAL EXAM: Constitutional: Alert and oriented. Well appearing and in no acute distress. Eyes: Conjunctivae are normal.  Head: Atraumatic. Nose: No congestion/rhinnorhea. Neck: No stridor.  No cervical tenderness on palpation posteriorly. Hematological/Lymphatic/Immunilogical: No cervical lymphadenopathy. Cardiovascular: Normal rate, regular rhythm. Grossly normal heart sounds.  Good peripheral circulation. Respiratory: Normal respiratory effort.   No retractions. Lungs CTAB. Gastrointestinal: Soft and nontender. No distention.  Bowel sounds normoactive x4 quadrants. Musculoskeletal: Nontender thoracic or lumbar spine palpation posteriorly.  Moves upper and lower extremities without any difficulty and no edema is noted to the lower extremities.  Good muscle strength bilaterally.  Normal gait was noted. Neurologic:  Normal speech and language. No gross focal neurologic deficits are appreciated. No gait instability. Skin:  Skin is warm, dry and intact. No rash noted. Psychiatric: Mood and affect are normal. Speech and behavior are normal.  ____________________________________________   LABS (all labs ordered are listed, but only abnormal results are displayed)  Labs were discussed with patient. ____________________________________________  EKG  Sinus rate with a ventricular rate of 52. ____________________________________________   FINAL CLINICAL IMPRESSION(S) / ED DIAGNOSES  Normal physical exam.   ED Discharge Orders    None       Note:  This document was prepared using Dragon voice recognition software and may include unintentional dictation errors.

## 2020-06-24 ENCOUNTER — Ambulatory Visit: Payer: Self-pay

## 2020-06-24 ENCOUNTER — Other Ambulatory Visit: Payer: Self-pay

## 2020-06-24 DIAGNOSIS — Z0283 Encounter for blood-alcohol and blood-drug test: Secondary | ICD-10-CM

## 2020-06-24 NOTE — Progress Notes (Signed)
Lab [employer] ID: [id number]

## 2021-02-26 ENCOUNTER — Ambulatory Visit: Payer: Self-pay

## 2021-02-26 ENCOUNTER — Other Ambulatory Visit: Payer: Self-pay

## 2021-02-26 DIAGNOSIS — Z0000 Encounter for general adult medical examination without abnormal findings: Secondary | ICD-10-CM

## 2021-02-26 NOTE — Progress Notes (Signed)
Pt scheduled annual physical 03/04/21.  Reviewed CDC recommendations for importance of HIV/ Hep C screening once in lifetime. Patient has declined HIV / Hep C screenings today and will let us know if they should change their mind in the future.   Pt will update covid vaccine date at scheduled physical.  Pt needs U/A as well.   CL,RMA

## 2021-02-27 LAB — CMP12+LP+TP+TSH+6AC+PSA+CBC…
ALT: 28 IU/L (ref 0–44)
AST: 26 IU/L (ref 0–40)
Albumin/Globulin Ratio: 2.5 — ABNORMAL HIGH (ref 1.2–2.2)
Albumin: 5 g/dL (ref 4.0–5.0)
Alkaline Phosphatase: 65 IU/L (ref 44–121)
BUN/Creatinine Ratio: 19 (ref 9–20)
BUN: 19 mg/dL (ref 6–24)
Basophils Absolute: 0 10*3/uL (ref 0.0–0.2)
Basos: 1 %
Bilirubin Total: 0.5 mg/dL (ref 0.0–1.2)
Calcium: 9.8 mg/dL (ref 8.7–10.2)
Chloride: 100 mmol/L (ref 96–106)
Chol/HDL Ratio: 3.4 ratio (ref 0.0–5.0)
Cholesterol, Total: 196 mg/dL (ref 100–199)
Creatinine, Ser: 0.98 mg/dL (ref 0.76–1.27)
EOS (ABSOLUTE): 0.1 10*3/uL (ref 0.0–0.4)
Eos: 2 %
Estimated CHD Risk: 0.5 times avg. (ref 0.0–1.0)
Free Thyroxine Index: 1.4 (ref 1.2–4.9)
GGT: 13 IU/L (ref 0–65)
Globulin, Total: 2 g/dL (ref 1.5–4.5)
Glucose: 103 mg/dL — ABNORMAL HIGH (ref 65–99)
HDL: 57 mg/dL (ref >39)
Hematocrit: 49.2 % (ref 37.5–51.0)
Hemoglobin: 16.8 g/dL (ref 13.0–17.7)
Immature Grans (Abs): 0 10*3/uL (ref 0.0–0.1)
Immature Granulocytes: 0 %
Iron: 92 ug/dL (ref 38–169)
LDH: 175 IU/L (ref 121–224)
LDL Chol Calc (NIH): 130 mg/dL — ABNORMAL HIGH (ref 0–99)
Lymphocytes Absolute: 1.4 10*3/uL (ref 0.7–3.1)
Lymphs: 45 %
MCH: 30.2 pg (ref 26.6–33.0)
MCHC: 34.1 g/dL (ref 31.5–35.7)
MCV: 89 fL (ref 79–97)
Monocytes Absolute: 0.3 10*3/uL (ref 0.1–0.9)
Monocytes: 9 %
Neutrophils Absolute: 1.3 10*3/uL — ABNORMAL LOW (ref 1.4–7.0)
Neutrophils: 43 %
Phosphorus: 3.4 mg/dL (ref 2.8–4.1)
Platelets: 266 10*3/uL (ref 150–450)
Potassium: 4.2 mmol/L (ref 3.5–5.2)
Prostate Specific Ag, Serum: 0.6 ng/mL (ref 0.0–4.0)
RBC: 5.56 x10E6/uL (ref 4.14–5.80)
RDW: 13.8 % (ref 11.6–15.4)
Sodium: 139 mmol/L (ref 134–144)
T3 Uptake Ratio: 31 % (ref 24–39)
T4, Total: 4.4 ug/dL — ABNORMAL LOW (ref 4.5–12.0)
TSH: 3 u[IU]/mL (ref 0.450–4.500)
Total Protein: 7 g/dL (ref 6.0–8.5)
Triglycerides: 46 mg/dL (ref 0–149)
Uric Acid: 6.2 mg/dL (ref 3.8–8.4)
VLDL Cholesterol Cal: 9 mg/dL (ref 5–40)
WBC: 3.1 10*3/uL — ABNORMAL LOW (ref 3.4–10.8)
eGFR: 100 mL/min/{1.73_m2} (ref >59)

## 2021-03-04 ENCOUNTER — Other Ambulatory Visit: Payer: Self-pay

## 2021-03-04 ENCOUNTER — Encounter: Payer: Self-pay | Admitting: Physician Assistant

## 2021-03-04 ENCOUNTER — Ambulatory Visit: Payer: Self-pay | Admitting: Physician Assistant

## 2021-03-04 VITALS — HR 69 | Temp 97.7°F | Resp 14 | Ht 69.5 in | Wt 185.6 lb

## 2021-03-04 DIAGNOSIS — M25552 Pain in left hip: Secondary | ICD-10-CM

## 2021-03-04 DIAGNOSIS — Z0000 Encounter for general adult medical examination without abnormal findings: Secondary | ICD-10-CM

## 2021-03-04 LAB — POCT URINALYSIS DIPSTICK
Bilirubin, UA: NEGATIVE
Blood, UA: NEGATIVE
Glucose, UA: NEGATIVE
Ketones, UA: NEGATIVE
Leukocytes, UA: NEGATIVE
Nitrite, UA: NEGATIVE
Protein, UA: NEGATIVE
Spec Grav, UA: 1.025 (ref 1.010–1.025)
Urobilinogen, UA: 0.2 E.U./dL
pH, UA: 6 (ref 5.0–8.0)

## 2021-03-04 NOTE — Progress Notes (Signed)
   Subjective: Annual employment exam    Patient ID: [name] [name], male    DOB: [birth year], 40 y.o.   MRN: [id number]  HPI Patient presents for annual physical exam.  Patient stated past 3 to 5 months experiencing increasing left lateral hip pain.  Patient is an avid runner.  No problem with the right hip.  Review of Systems Negative except for complaint of left hip pain.    Objective:   Physical Exam No acute distress.  Temperature is 97.7, BP is 136/90, pulse 64, respiration 14, patient O2 sat is 99% on room air.  Patient with 185 pounds and 3 pound increase from last year.  BMI is 27. HEENT is unremarkable.  Neck is supple without adenopathy or bruits.  Lungs are clear to auscultation.  Heart is regular rate and rhythm.  EKG showed asymptomatic bradycardia. Abdomen is negative HSM, normoactive bowel sounds, soft, nontender to palpation. No obvious deformity to the upper or lower extremities.  Patient has full and equal range of motion of the upper and lower extremities.  Patient has moderate guarding palpation of the left greater trochanter left hip. No obvious cervical or lumbar spine deformity.  Patient has full and equal range of motion of the cervical lumbar spine. Cranial nerves II through XII are grossly intact.  DTRs are 2+ without clonus.       Assessment & Plan: Well exam.   Discussed lab results with patient.  Cholesterol has decreased from 294 to 196, triglycerides has decreased from 100 to 46, HDL has increased from 20 to 57, and LDL has decreased from  256-130.  Patient states improvement to mostly with diet.  An order for x-ray of the left hip/pelvic was generated today.  Patient will follow up status post procedure.

## 2021-03-04 NOTE — Addendum Note (Signed)
Addended by: Gardner Candle on: 03/04/2021 08:56 AM   Modules accepted: Orders

## 2021-03-04 NOTE — Addendum Note (Signed)
Addended by: Christianne Dolin F on: 03/04/2021 10:59 AM   Modules accepted: Orders

## 2021-03-04 NOTE — Progress Notes (Signed)
Pt has concerns of thyroid levels due to labs. CL,RMA

## 2022-03-02 ENCOUNTER — Ambulatory Visit: Payer: Self-pay

## 2022-03-02 DIAGNOSIS — Z0000 Encounter for general adult medical examination without abnormal findings: Secondary | ICD-10-CM

## 2022-03-02 LAB — POCT URINALYSIS DIPSTICK
Bilirubin, UA: NEGATIVE
Blood, UA: NEGATIVE
Glucose, UA: NEGATIVE
Ketones, UA: NEGATIVE
Leukocytes, UA: NEGATIVE
Nitrite, UA: NEGATIVE
Protein, UA: NEGATIVE
Spec Grav, UA: 1.015 (ref 1.010–1.025)
Urobilinogen, UA: 0.2 E.U./dL
pH, UA: 6 (ref 5.0–8.0)

## 2022-03-02 NOTE — Progress Notes (Signed)
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 Will return to clinic for scheduled physical.  Pt BP is elevated today 158/103 P 70.  Pt physical scheduled for Monday 03/08/22, I advised pt to come in two more times before Monday to get BP checked so he can have reading ready for physical.

## 2022-03-03 LAB — CMP12+LP+TP+TSH+6AC+PSA+CBC…
ALT: 31 IU/L (ref 0–44)
AST: 28 IU/L (ref 0–40)
Albumin/Globulin Ratio: 2.1 (ref 1.2–2.2)
Albumin: 5 g/dL (ref 4.1–5.1)
Alkaline Phosphatase: 69 IU/L (ref 44–121)
BUN/Creatinine Ratio: 18 (ref 9–20)
BUN: 18 mg/dL (ref 6–24)
Basophils Absolute: 0 10*3/uL (ref 0.0–0.2)
Basos: 1 %
Bilirubin Total: 0.4 mg/dL (ref 0.0–1.2)
Calcium: 9.7 mg/dL (ref 8.7–10.2)
Chloride: 101 mmol/L (ref 96–106)
Chol/HDL Ratio: 3.6 ratio (ref 0.0–5.0)
Cholesterol, Total: 203 mg/dL — ABNORMAL HIGH (ref 100–199)
Creatinine, Ser: 0.98 mg/dL (ref 0.76–1.27)
EOS (ABSOLUTE): 0 10*3/uL (ref 0.0–0.4)
Eos: 1 %
Estimated CHD Risk: 0.6 times avg. (ref 0.0–1.0)
Free Thyroxine Index: 1.6 (ref 1.2–4.9)
GGT: 14 IU/L (ref 0–65)
Globulin, Total: 2.4 g/dL (ref 1.5–4.5)
Glucose: 110 mg/dL — ABNORMAL HIGH (ref 70–99)
HDL: 57 mg/dL (ref >39)
Hematocrit: 48.4 % (ref 37.5–51.0)
Hemoglobin: 16.7 g/dL (ref 13.0–17.7)
Immature Grans (Abs): 0 10*3/uL (ref 0.0–0.1)
Immature Granulocytes: 1 %
Iron: 101 ug/dL (ref 38–169)
LDH: 163 IU/L (ref 121–224)
LDL Chol Calc (NIH): 134 mg/dL — ABNORMAL HIGH (ref 0–99)
Lymphocytes Absolute: 1.2 10*3/uL (ref 0.7–3.1)
Lymphs: 44 %
MCH: 31.3 pg (ref 26.6–33.0)
MCHC: 34.5 g/dL (ref 31.5–35.7)
MCV: 91 fL (ref 79–97)
Monocytes Absolute: 0.3 10*3/uL (ref 0.1–0.9)
Monocytes: 11 %
Neutrophils Absolute: 1.1 10*3/uL — ABNORMAL LOW (ref 1.4–7.0)
Neutrophils: 42 %
Phosphorus: 3.2 mg/dL (ref 2.8–4.1)
Platelets: 239 10*3/uL (ref 150–450)
Potassium: 4 mmol/L (ref 3.5–5.2)
Prostate Specific Ag, Serum: 0.5 ng/mL (ref 0.0–4.0)
RBC: 5.33 x10E6/uL (ref 4.14–5.80)
RDW: 12.6 % (ref 11.6–15.4)
Sodium: 139 mmol/L (ref 134–144)
T3 Uptake Ratio: 31 % (ref 24–39)
T4, Total: 5 ug/dL (ref 4.5–12.0)
TSH: 3.34 u[IU]/mL (ref 0.450–4.500)
Total Protein: 7.4 g/dL (ref 6.0–8.5)
Triglycerides: 64 mg/dL (ref 0–149)
Uric Acid: 5.7 mg/dL (ref 3.8–8.4)
VLDL Cholesterol Cal: 12 mg/dL (ref 5–40)
WBC: 2.7 10*3/uL — ABNORMAL LOW (ref 3.4–10.8)
eGFR: 99 mL/min/{1.73_m2} (ref >59)

## 2022-03-05 ENCOUNTER — Ambulatory Visit: Payer: Self-pay

## 2022-03-05 VITALS — BP 130/70

## 2022-03-05 DIAGNOSIS — Z0130 Encounter for examination of blood pressure without abnormal findings: Secondary | ICD-10-CM

## 2022-03-05 NOTE — Progress Notes (Signed)
States he worked last night at [employer] this morning he ran 2 miles before coming to the clinic.  Returns to clinic Monday (03/08/2022) to complete annual physical with Nona Dell, PA-C.  AMD

## 2022-03-08 ENCOUNTER — Ambulatory Visit: Payer: Self-pay | Admitting: Physician Assistant

## 2022-03-08 ENCOUNTER — Encounter: Payer: Self-pay | Admitting: Physician Assistant

## 2022-03-08 VITALS — BP 130/82 | HR 78 | Temp 97.5°F | Resp 12 | Ht 70.0 in | Wt 189.0 lb

## 2022-03-08 DIAGNOSIS — Z0000 Encounter for general adult medical examination without abnormal findings: Secondary | ICD-10-CM

## 2022-03-08 NOTE — Progress Notes (Signed)
[facility] ____________________________________________   None    (approximate)  I have reviewed the triage vital signs and the nursing notes.   HISTORY  Chief Complaint Employment Physical Engineer, drilling Physical)    HPI [name] [name] is a 41 y.o. male patient presents for annual physical exam.  Patient voices complain of intermittent dyspnea and elevated blood pressure.  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  Patient stated he is able to run 3 to 4 miles without distress.  Patient also states he has monitor his blood pressure at home and has elevations of 150/100.  Patient denies vision disturbance, weakness, or vertigo.  Patient states has transient episodes of fatigue.     TOMS (pertinent positives and negatives)**} Past Medical History:  Diagnosis Date   Fatigue    Migraines    Patient denies medical problems     Patient Active Problem List   Diagnosis Date Noted   Fracture of phalanx of finger 06/06/2017    Past Surgical History:  Procedure Laterality Date   VASECTOMY  07/02/2019    Prior to Admission medications   Not on File    Allergies Patient has no known allergies.  Family History  Problem Relation Age of Onset   Breast cancer Mother        08/11/2016    Social History Social History   Tobacco Use   Smoking status: Never   Smokeless tobacco: Never  Substance Use Topics   Alcohol use: Yes   Drug use: No    Review of Systems {Constitutional: No fever/chills Eyes: No visual changes. ENT: No sore throat. Cardiovascular: Denies chest pain. Respiratory: Denies shortness of breath. Gastrointestinal: No abdominal pain.  No nausea, no vomiting.  No diarrhea.  No constipation. Genitourinary: Negative for dysuria. Musculoskeletal: Negative for back pain. Skin: Negative for rash. Neurological: Negative for headaches, focal weakness  or numbness. ____________________________________________   PHYSICAL EXAM:  VITAL SIGNS: BP is 140/80, pulse 78, respiration 12, temperature 97.5, and patient is 98% O2 sat on room air.  Patient weighs 189 pounds and BMI is 27.12. Constitutional: Alert and oriented. Well appearing and in no acute distress. Eyes: Conjunctivae are normal. PERRL. EOMI. Head: Atraumatic. Nose: No congestion/rhinnorhea. Mouth/Throat: Mucous membranes are moist.  Oropharynx non-erythematous. Neck: No stridor.  No cervical spine tenderness to palpation. Hematological/Lymphatic/Immunilogical: No cervical lymphadenopathy. Cardiovascular: Normal rate, regular rhythm. Grossly normal heart sounds.  Good peripheral circulation. Respiratory: Normal respiratory effort.  No retractions. Lungs CTAB. Gastrointestinal: Soft and nontender. No distention. No abdominal bruits. No CVA tenderness. Genitourinary: Deferred Musculoskeletal: No lower extremity tenderness nor edema.  No joint effusions. Neurologic:  Normal speech and language. No gross focal neurologic deficits are appreciated. No gait instability. Skin:  Skin is warm, dry and intact. No rash noted. Psychiatric: Mood and affect are normal. Speech and behavior are normal.  ____________________________________________   LABS _         Component Ref Range & Units 6 d ago (03/02/22) 1 yr ago (02/26/21) 2 yr ago (02/29/20) 2 yr ago (07/30/19) 5 yr ago (12/08/16)  Glucose 70 - 99 mg/dL 110 High   103 High  R  100 High  R  103 High  R    Uric Acid 3.8 - 8.4 mg/dL 5.7  6.2 CM  5.9 CM  5.9 CM    Comment:            Therapeutic target for  gout patients: <6.0  BUN 6 - 24 mg/dL _0 R  23 High  R    Creatinine, Ser 0.76 - 1.27 mg/dL 0.98  0.98  1.12  1.04    eGFR >59 mL/min/1.73 99  100      BUN/Creatinine Ratio 9 - _1 High     Sodium 134 - 144 mmol/L 139  139  141  141    Potassium 3.5 - 5.2 mmol/L 4.0  4.2  4.4  4.0    Chloride 96 - 106 mmol/L  101  100  102  102    Calcium 8.7 - 10.2 mg/dL 9.7  9.8  9.8  10.0    Phosphorus 2.8 - 4.1 mg/dL 3.2  3.4  3.4  4.3 High     Total Protein 6.0 - 8.5 g/dL 7.4  7.0  8.1  7.4    Albumin 4.1 - 5.1 g/dL 5.0  5.0 R  5.2 High  R  5.0 R    Globulin, Total 1.5 - 4.5 g/dL 2.4  2.0  2.9  2.4    Albumin/Globulin Ratio 1.2 - 2.2 2.1  2.5 High   1.8  2.1    Bilirubin Total 0.0 - 1.2 mg/dL 0.4  0.5  0.5  0.3    Alkaline Phosphatase 44 - 121 IU/L 69  65  46 Low  R  80 R    LDH 121 - 224 IU/L 163  175  191  189    AST 0 - 40 IU/L _2 ALT 0 - 44 IU/L _3 GGT 0 - 65 IU/L _4 Iron 38 - 169 ug/dL 101  92  87  105    Cholesterol, Total 100 - 199 mg/dL 203 High   196  294 High   179    Triglycerides 0 - 149 mg/dL 64  46  100  65    HDL >39 mg/dL 57  57  20 Low   62    VLDL Cholesterol Cal 5 - 40 mg/dL _5 LDL Chol Calc (NIH) 0 - 99 mg/dL 134 High   130 High   256 High   105 High     Chol/HDL Ratio 0.0 - 5.0 ratio 3.6  3.4 CM  14.7 High  CM  2.9 CM    Comment:                                   T. Chol/HDL Ratio                                              Men  Women                                1/2 Avg.Risk  3.4    3.3                                    Avg.Risk  5.0    4.4                                 2X Avg.Risk  9.6    7.1                                 3X Avg.Risk 23.4   11.0   Estimated CHD Risk 0.0 - 1.0 times avg. 0.6  0.5 CM  2.4 High  CM   < 0.5 CM    Comment: The CHD Risk is based on the T. Chol/HDL ratio. Other  factors affect CHD Risk such as hypertension, smoking,  diabetes, severe obesity, and family history of  premature CHD.   TSH 0.450 - 4.500 uIU/mL 3.340  3.000  3.670  3.420    T4, Total 4.5 - 12.0 ug/dL 5.0  4.4 Low   4.1 Low   4.7    T3 Uptake Ratio 24 - 39 % _0 Free Thyroxine Index 1.2 - 4.9 1.6  1.4  1.3  1.3    Prostate Specific Ag, Serum 0.0 - 4.0 ng/mL 0.5  0.6 CM   0.6 CM    Comment: Roche ECLIA  methodology.  According to the American Urological Association, Serum PSA should  decrease and remain at undetectable levels after radical  prostatectomy. The AUA defines biochemical recurrence as an initial  PSA value 0.2 ng/mL or greater followed by a subsequent confirmatory  PSA value 0.2 ng/mL or greater.  Values obtained with different assay methods or kits cannot be used  interchangeably. Results cannot be interpreted as absolute evidence  of the presence or absence of malignant disease.   WBC 3.4 - 10.8 x10E3/uL 2.7 Low   3.1 Low   3.2 Low   4.5  4.2 R   RBC 4.14 - 5.80 x10E6/uL 5.33  5.56  5.21  5.54  5.28 R   Hemoglobin 13.0 - 17.7 g/dL 16.7  16.8  15.4  16.9  16.0 R   Hematocrit 37.5 - 51.0 % 48.4  49.2  44.0  48.8  46.7 R   MCV 79 - 97 fL 91  89  85  88  88.3 R   MCH 26.6 - 33.0 pg 31.3  30.2  29.6  30.5  30.4 R   MCHC 31.5 - 35.7 g/dL 34.5  34.1  35.0  34.6  34.4 R   RDW 11.6 - 15.4 % 12.6  13.8  12.9  12.9  13.0 R   Platelets 150 - 450 x10E3/uL 239  266  332  255  233 R   Neutrophils Not Estab. % 42  43  43  47  61 R   Lymphs Not Estab. % 44  45  46  40    Monocytes Not Estab. % _1 Eos Not Estab. % _2 Basos Not Estab. % _3 Neutrophils Absolute 1.4 - 7.0 x10E3/uL 1.1 Low   1.3 Low   1.4  2.2  2.6 R   Lymphocytes Absolute 0.7 - 3.1 x10E3/uL 1.2  1.4  1.5  1.8  1.3 R   Monocytes Absolute 0.1 - 0.9 x10E3/uL 0.3  0.3  0.3  0.4  EOS (ABSOLUTE) 0.0 - 0.4 x10E3/uL 0.0  0.1  0.0  0.1    Basophils Absolute 0.0 - 0.2 x10E3/uL 0.0  0.0  0.0  0.0  0.1 R   Immature Granulocytes Not Estab. % 1  0  0  0    Immature Grans           _0 Result Notes        Component Ref Range & Units 6 d ago (03/02/22) 1 yr ago (03/04/21) 2 yr ago (02/29/20) 2 yr ago (07/30/19)  Color, UA  yellow  yellow  DARK YELLOW  Yellow   Clarity, UA  clear  clear  CLEAR  Clear   Glucose, UA Negative Negative  Negative  Negative  Negative   Bilirubin, UA  negative  negaative   NEGATIVE  Negative   Ketones, UA  negative  negative  NEGATIVE  Negative   Spec Grav, UA 1.010 - 1.025 1.015  1.025  1.025  >=1.030 Abnormal    Blood, UA  negative  negative  NEGATIVE  Negative   pH, UA 5.0 - 8.0 6.0  6.0  6.0  5.5   Protein, UA Negative Negative  Negative  Negative  Negative   Urobilinogen, UA 0.2 or 1.0 E.U./dL 0.2  0.2  0.2  0.2   Nitrite, UA  negative  negative  NEGATIVE  Negative   Leukocytes, UA Negative Negative  Negative  Negative  Negative   Appearance   medium                __________________________________________  EKG Normal sinus rhythm  ____________________________________________   ____________________________________________   INITIAL IMPRESSION / ASSESSMENT AND PLAN  As part of my medical decision making, I reviewed the following data within the electronic MEDICAL RECORD NUMBER      Discussed no acute findings on labs and EKG.  Patient will be scheduled for spirometer evaluation and a 3-day blood pressure check.        ____________________________________________   FINAL CLINICAL IMPRESSION  Well exam   ED Discharge Orders     None        Note:  This document was prepared using Dragon voice recognition software and may include unintentional dictation errors.

## 2022-03-09 ENCOUNTER — Ambulatory Visit: Payer: Self-pay

## 2022-03-09 VITALS — BP 151/79

## 2022-03-09 DIAGNOSIS — Z0130 Encounter for examination of blood pressure without abnormal findings: Secondary | ICD-10-CM

## 2022-03-09 NOTE — Progress Notes (Signed)
Presents to [facility] for 1st of 3 BP checks.  AMD

## 2022-03-10 NOTE — Addendum Note (Signed)
Addended by: Christianne Dolin F on: 03/10/2022 04:49 PM   Modules accepted: Orders

## 2022-08-21 DIAGNOSIS — J069 Acute upper respiratory infection, unspecified: Secondary | ICD-10-CM | POA: Diagnosis not present

## 2022-12-08 DIAGNOSIS — M79672 Pain in left foot: Secondary | ICD-10-CM | POA: Insufficient documentation

## 2023-02-11 DIAGNOSIS — Z0000 Encounter for general adult medical examination without abnormal findings: Secondary | ICD-10-CM | POA: Diagnosis not present

## 2023-02-25 ENCOUNTER — Ambulatory Visit (INDEPENDENT_AMBULATORY_CARE_PROVIDER_SITE_OTHER): Payer: 59 | Admitting: Podiatry

## 2023-02-25 ENCOUNTER — Ambulatory Visit (INDEPENDENT_AMBULATORY_CARE_PROVIDER_SITE_OTHER): Payer: 59

## 2023-02-25 ENCOUNTER — Encounter: Payer: Self-pay | Admitting: Podiatry

## 2023-02-25 DIAGNOSIS — M778 Other enthesopathies, not elsewhere classified: Secondary | ICD-10-CM

## 2023-02-25 DIAGNOSIS — M722 Plantar fascial fibromatosis: Secondary | ICD-10-CM

## 2023-02-25 DIAGNOSIS — M7742 Metatarsalgia, left foot: Secondary | ICD-10-CM | POA: Diagnosis not present

## 2023-02-25 DIAGNOSIS — M2042 Other hammer toe(s) (acquired), left foot: Secondary | ICD-10-CM | POA: Diagnosis not present

## 2023-02-25 DIAGNOSIS — M2041 Other hammer toe(s) (acquired), right foot: Secondary | ICD-10-CM

## 2023-02-25 NOTE — Progress Notes (Signed)
   Chief Complaint  Patient presents with   Foot Pain    "My left foot hurts after I've sat for a while or first thing in the morning." N - foot pain L - 3,4,5 MPJ left D - Jan. O - gradually worse C - ache A - sitting a while, first thing in the morning T - Ibuprofen every now and then    HPI: 42 y.o. male presenting today as a new patient for above complaint  Past Medical History:  Diagnosis Date   Fatigue    Migraines    Patient denies medical problems     Past Surgical History:  Procedure Laterality Date   VASECTOMY  07/02/2019    No Known Allergies   Physical Exam: General: The patient is alert and oriented x3 in no acute distress.  Dermatology: Skin is warm, dry and supple bilateral lower extremities.   Vascular: Palpable pedal pulses bilaterally. Capillary refill within normal limits.  No appreciable edema.  No erythema.  Neurological: Grossly intact via light touch  Musculoskeletal Exam: Hammertoe deformity noted with retrograde pressure to the lesser MTPs of the foot  Radiographic Exam:  Normal osseous mineralization. Joint spaces preserved.  No fractures or osseous irregularities noted.  Assessment/Plan of Care: 1. 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 Hammertoes b/l feet 3. Plantar fasciitis b/l   -pt evaluated. Xrays reviewed -pt would benefit from custom orthotics to offload pressure from the forefoot and equally distribute pressure. Pt will contact his insurance to see if orthotics are covered. If they are rec appt with orthotics dept  -in the meantime prefabricated otc powerstep insoles dispensed today -cont Motrin 800 prn -advise against going barefoot. 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 Felecia Shelling, DPM    [address] [place], [place] [address]                Office [phone]  Fax [phone]

## 2023-02-28 ENCOUNTER — Ambulatory Visit: Payer: 59 | Admitting: Physician Assistant

## 2023-02-28 ENCOUNTER — Encounter: Payer: Self-pay | Admitting: Physician Assistant

## 2023-02-28 ENCOUNTER — Ambulatory Visit: Payer: Self-pay | Admitting: Physician Assistant

## 2023-02-28 VITALS — BP 140/86 | HR 61 | Temp 97.3°F | Resp 12 | Ht 69.0 in | Wt 199.0 lb

## 2023-02-28 DIAGNOSIS — Z0000 Encounter for general adult medical examination without abnormal findings: Secondary | ICD-10-CM

## 2023-02-28 NOTE — Progress Notes (Signed)
    [facility]   ____________________________________________   None    (approximate)  I have reviewed the triage vital signs and the nursing notes.   HISTORY  Chief Complaint No chief complaint on file.    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  Voices no concerns or complaints.         Past Medical History:  Diagnosis Date   Fatigue    Migraines    Patient denies medical problems     Patient Active Problem List   Diagnosis Date Noted   Fracture of phalanx of finger 06/06/2017    Past Surgical History:  Procedure Laterality Date   VASECTOMY  07/02/2019    Prior to Admission medications   Not on File    Allergies Patient has no known allergies.  Family History  Problem Relation Age of Onset   Breast cancer Mother        08/11/2016    Social History Social History   Tobacco Use   Smoking status: Never   Smokeless tobacco: Never  Substance Use Topics   Alcohol use: Yes    Comment: occasionally   Drug use: No    Review of Systems Constitutional: No fever/chills Eyes: No visual changes. ENT: No sore throat. Cardiovascular: Denies chest pain. Respiratory: Denies shortness of breath. Gastrointestinal: No abdominal pain.  No nausea, no vomiting.  No diarrhea.  No constipation. Genitourinary: Negative for dysuria. Musculoskeletal: Negative for back pain. Skin: Negative for rash. Neurological: Negative for headaches, focal weakness or numbness.  ____________________________________________   PHYSICAL EXAM:  VITAL SIGNS:     BP 146/91 140/86  BP Location Left Arm Left Arm  Patient Position Sitting Sitting  Cuff Size Large Large  Pulse 61   Resp 12   Temp 97.3 F (36.3 C)   Temp src Temporal   SpO2 99 %   Weight 199 lb (90.3 kg)   Height 5\' 9"  (1.753 m)     Constitutional: Alert and oriented. Well appearing and in no acute distress. Eyes:  Conjunctivae are normal. PERRL. EOMI. Head: Atraumatic. Nose: No congestion/rhinnorhea. Mouth/Throat: Mucous membranes are moist.  Oropharynx non-erythematous. Neck: No stridor.  No cervical spine tenderness to palpation. Hematological/Lymphatic/Immunilogical: No cervical lymphadenopathy. Cardiovascular: Normal rate, regular rhythm. Grossly normal heart sounds.  Good peripheral circulation. Respiratory: Normal respiratory effort.  No retractions. Lungs CTAB. Gastrointestinal: Soft and nontender. No distention. No abdominal bruits. No CVA tenderness. Genitourinary: Deferred Musculoskeletal: No lower extremity tenderness nor edema.  No joint effusions. Neurologic:  Normal speech and language. No gross focal neurologic deficits are appreciated. No gait instability. Skin:  Skin is warm, dry and intact. No rash noted. Psychiatric: Mood and affect are normal. Speech and behavior are normal.  ____________________________________________   LABS _Reviewed ______________________________  EKG Bradycardic at 54 beats per minute _______________________________________   ____________________________________________   INITIAL IMPRESSION / ASSESSMENT AND PLAN  As part of my medical decision making, I reviewed the following data within the electronic MEDICAL RECORD NUMBER       No acute findings on physical exam, EKG, or labs.     ____________________________________________   FINAL CLINICAL IMPRESSION Well exam   ED Discharge Orders     None        Note:  This document was prepared using Dragon voice recognition software and may include unintentional dictation errors.

## 2023-03-08 ENCOUNTER — Ambulatory Visit: Payer: 59

## 2023-03-08 DIAGNOSIS — H6123 Impacted cerumen, bilateral: Secondary | ICD-10-CM

## 2023-03-08 NOTE — Progress Notes (Signed)
Pt presents today for ear lavage in both ears. Both ears are cleared. Pt tolerated very well.

## 2023-12-27 ENCOUNTER — Other Ambulatory Visit: Payer: Self-pay

## 2023-12-27 DIAGNOSIS — Z0283 Encounter for blood-alcohol and blood-drug test: Secondary | ICD-10-CM

## 2023-12-27 NOTE — Progress Notes (Signed)
 Random UDS and ETOH completed [facility].

## 2023-12-27 NOTE — Progress Notes (Signed)
 Presents to [facility] for random Safety Sensitive position drug screen & alcohol screen.  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

## 2024-02-24 ENCOUNTER — Ambulatory Visit: Payer: Self-pay

## 2024-02-24 DIAGNOSIS — Z0000 Encounter for general adult medical examination without abnormal findings: Secondary | ICD-10-CM

## 2024-02-24 LAB — POCT URINALYSIS DIPSTICK
Bilirubin, UA: NEGATIVE
Blood, UA: NEGATIVE
Glucose, UA: NEGATIVE
Ketones, UA: NEGATIVE
Leukocytes, UA: NEGATIVE
Nitrite, UA: NEGATIVE
Protein, UA: NEGATIVE
Spec Grav, UA: 1.02 (ref 1.010–1.025)
Urobilinogen, UA: 0.2 U/dL
pH, UA: 6 (ref 5.0–8.0)

## 2024-02-25 LAB — CMP12+LP+TP+TSH+6AC+PSA+CBC…
ALT: 37 IU/L (ref 0–44)
AST: 32 IU/L (ref 0–40)
Albumin: 5.2 g/dL — ABNORMAL HIGH (ref 4.1–5.1)
Alkaline Phosphatase: 63 IU/L (ref 44–121)
BUN/Creatinine Ratio: 19 (ref 9–20)
BUN: 18 mg/dL (ref 6–24)
Basophils Absolute: 0 x10E3/uL (ref 0.0–0.2)
Basos: 1 %
Bilirubin Total: 0.7 mg/dL (ref 0.0–1.2)
Calcium: 9.6 mg/dL (ref 8.7–10.2)
Chloride: 101 mmol/L (ref 96–106)
Chol/HDL Ratio: 3.7 ratio (ref 0.0–5.0)
Cholesterol, Total: 209 mg/dL — ABNORMAL HIGH (ref 100–199)
Creatinine, Ser: 0.93 mg/dL (ref 0.76–1.27)
EOS (ABSOLUTE): 0.1 x10E3/uL (ref 0.0–0.4)
Eos: 3 %
Estimated CHD Risk: 0.6 times avg. (ref 0.0–1.0)
Free Thyroxine Index: 1.3 (ref 1.2–4.9)
GGT: 16 IU/L (ref 0–65)
Globulin, Total: 2.6 g/dL (ref 1.5–4.5)
Glucose: 83 mg/dL (ref 70–99)
HDL: 57 mg/dL (ref >39)
Hematocrit: 52.6 % — ABNORMAL HIGH (ref 37.5–51.0)
Hemoglobin: 17.7 g/dL (ref 13.0–17.7)
Immature Grans (Abs): 0 x10E3/uL (ref 0.0–0.1)
Immature Granulocytes: 0 %
Iron: 180 ug/dL — ABNORMAL HIGH (ref 38–169)
LDH: 202 IU/L (ref 121–224)
LDL Chol Calc (NIH): 140 mg/dL — ABNORMAL HIGH (ref 0–99)
Lymphocytes Absolute: 1.6 x10E3/uL (ref 0.7–3.1)
Lymphs: 45 %
MCH: 31.3 pg (ref 26.6–33.0)
MCHC: 33.7 g/dL (ref 31.5–35.7)
MCV: 93 fL (ref 79–97)
Monocytes Absolute: 0.4 x10E3/uL (ref 0.1–0.9)
Monocytes: 11 %
Neutrophils Absolute: 1.4 x10E3/uL (ref 1.4–7.0)
Neutrophils: 40 %
Phosphorus: 3.2 mg/dL (ref 2.8–4.1)
Platelets: 264 x10E3/uL (ref 150–450)
Potassium: 4.5 mmol/L (ref 3.5–5.2)
Prostate Specific Ag, Serum: 0.9 ng/mL (ref 0.0–4.0)
RBC: 5.66 x10E6/uL (ref 4.14–5.80)
RDW: 12.9 % (ref 11.6–15.4)
Sodium: 139 mmol/L (ref 134–144)
T3 Uptake Ratio: 28 % (ref 24–39)
T4, Total: 4.7 ug/dL (ref 4.5–12.0)
TSH: 2.62 u[IU]/mL (ref 0.450–4.500)
Total Protein: 7.8 g/dL (ref 6.0–8.5)
Triglycerides: 66 mg/dL (ref 0–149)
Uric Acid: 6.9 mg/dL (ref 3.8–8.4)
VLDL Cholesterol Cal: 12 mg/dL (ref 5–40)
WBC: 3.5 x10E3/uL (ref 3.4–10.8)
eGFR: 104 mL/min/1.73 (ref >59)

## 2024-02-27 ENCOUNTER — Ambulatory Visit: Payer: Self-pay | Admitting: Physician Assistant

## 2024-02-27 ENCOUNTER — Encounter: Payer: Self-pay | Admitting: Physician Assistant

## 2024-02-27 VITALS — BP 126/77 | Temp 97.7°F | Resp 14 | Ht 70.0 in | Wt 188.0 lb

## 2024-02-27 DIAGNOSIS — Z0000 Encounter for general adult medical examination without abnormal findings: Secondary | ICD-10-CM

## 2024-02-27 NOTE — Progress Notes (Signed)
 [facility]   ____________________________________________   None    (approximate)  I have reviewed the triage vital signs and the nursing notes.   HISTORY  Chief Complaint Employment Physical   HPI [name] [name] is a 43 y.o. male patient presents for annual physical exam.  Voices no concerns or complaints         Past Medical History:  Diagnosis Date   Fatigue    Migraines    Patient denies medical problems     Patient Active Problem List   Diagnosis Date Noted   Foot pain, left 12/08/2022   Fracture of phalanx of finger 06/06/2017    Past Surgical History:  Procedure Laterality Date   VASECTOMY  07/02/2019    Prior to Admission medications   Medication Sig Start Date End Date Taking? Authorizing Provider  testosterone  cypionate (DEPOTESTOSTERONE CYPIONATE) 200 MG/ML injection Inject 200 mg into the muscle every 14 (fourteen) days. 11/25/23  Yes [provider]    Allergies Patient has no known allergies.  Family History  Problem Relation Age of Onset   Breast cancer Mother        08/11/2016    Social History Social History   Tobacco Use   Smoking status: Never   Smokeless tobacco: Never  Substance Use Topics   Alcohol use: Yes    Comment: occasionally   Drug use: No    Review of Systems Constitutional: No fever/chills Eyes: No visual changes. ENT: No sore throat. Cardiovascular: Denies chest pain. Respiratory: Denies shortness of breath. Gastrointestinal: No abdominal pain.  No nausea, no vomiting.  No diarrhea.  No constipation. Genitourinary: Negative for dysuria. Musculoskeletal: Negative for back pain. Skin: Negative for rash. Neurological: Negative for headaches, focal weakness or numbness.  ____________________________________________   PHYSICAL EXAM:  VITAL SIGNS: BP 126/77  Cuff Size Normal  Temp 97.7 F (36.5 C)  Temp Source Temporal  Weight 188 lb (85.3 kg)  Height 5'  10 (1.778 m)  Resp 14  SpO2 99 %   BMI: 26.98 kg/m2  BSA: 2.05 m2   Constitutional: Alert and oriented. Well appearing and in no acute distress. Eyes: Conjunctivae are normal. PERRL. EOMI. Head: Atraumatic. Nose: No congestion/rhinnorhea. Mouth/Throat: Mucous membranes are moist.  Oropharynx non-erythematous. Neck: No stridor.  No cervical spine tenderness to palpation. Hematological/Lymphatic/Immunilogical: No cervical lymphadenopathy. Cardiovascular: Normal rate, regular rhythm. Grossly normal heart sounds.  Good peripheral circulation. Respiratory: Normal respiratory effort.  No retractions. Lungs CTAB. Gastrointestinal: Soft and nontender. No distention. No abdominal bruits. No CVA tenderness. Genitourinary: Deferred Musculoskeletal: No lower extremity tenderness nor edema.  No joint effusions. Neurologic:  Normal speech and language. No gross focal neurologic deficits are appreciated. No gait instability. Skin:  Skin is warm, dry and intact. No rash noted. Psychiatric: Mood and affect are normal. Speech and behavior are normal.  ____________________________________________   LABS         Component Ref Range & Units (hover) 3 d ago 1 yr ago 2 yr ago 3 yr ago 4 yr ago  Color, UA Dark Yellow yellow yellow DARK YELLOW Yellow  Clarity, UA Clear clear clear CLEAR Clear  Glucose, UA Negative Negative Negative Negative Negative  Bilirubin, UA Negative negative negaative NEGATIVE Negative  Ketones, UA Negative negative negative NEGATIVE Negative  Spec Grav, UA 1.020 1.015 1.025 1.025 >=1.030 Abnormal   Blood, UA Negative negative negative NEGATIVE Negative  pH, UA 6.0 6.0 6.0 6.0 5.5  Protein, UA Negative Negative Negative Negative Negative  Urobilinogen, UA 0.2 0.2 0.2 0.2 0.2  Nitrite, UA Negative negative negative NEGATIVE Negative  Leukocytes, UA Negative Negative Negative Negative Negative  Appearance   medium    Odor          8/25) 1 yr ago (03/02/22) 3 yr  ago (02/26/21) 3 yr ago (02/29/20) 4 yr ago (07/30/19) 7 yr ago (12/08/16)   Glucose 83 110 High  103 High  R 100 High  R 103 High  R   Uric Acid 6.9 5.7 CM 6.2 CM 5.9 CM 5.9 CM   Comment:            Therapeutic target for gout patients: <6.0  BUN 18 18 19 16  R 23 High  R   Creatinine, Ser 0.93 0.98 0.98 1.12 1.04   eGFR 104 99 100     BUN/Creatinine Ratio 19 18 19 14 22  High    Sodium 139 139 139 141 141   Potassium 4.5 4.0 4.2 4.4 4.0   Chloride 101 101 100 102 102   Calcium 9.6 9.7 9.8 9.8 10.0   Phosphorus 3.2 3.2 3.4 3.4 4.3 High    Total Protein 7.8 7.4 7.0 8.1 7.4   Albumin 5.2 High  5.0 5.0 R 5.2 High  R 5.0 R   Globulin, Total 2.6 2.4 2.0 2.9 2.4   Bilirubin Total 0.7 0.4 0.5 0.5 0.3   Alkaline Phosphatase 63 69 65 46 Low  R 80 R   LDH 202 163 175 191 189   AST 32 28 26 27 24    ALT 37 31 28 23 24    GGT 16 14 13 11 14    Iron 180 High  101 92 87 105   Cholesterol, Total 209 High  203 High  196 294 High  179   Triglycerides 66 64 46 100 65   HDL 57 57 57 20 Low  62   VLDL Cholesterol Cal 12 12 9 18 12    LDL Chol Calc (NIH) 140 High  134 High  130 High  256 High  105 High    Chol/HDL Ratio 3.7 3.6 CM 3.4 CM 14.7 High  CM 2.9 CM   Comment:                                   T. Chol/HDL Ratio                                             Men  Women                               1/2 Avg.Risk  3.4    3.3                                   Avg.Risk  5.0    4.4                                2X Avg.Risk  9.6    7.1  3X Avg.Risk 23.4   11.0  Estimated CHD Risk 0.6 0.6 CM 0.5 CM 2.4 High  CM  < 0.5 CM   Comment: The CHD Risk is based on the T. Chol/HDL ratio. Other factors affect CHD Risk such as hypertension, smoking, diabetes, severe obesity, and family history of premature CHD.  TSH 2.620 3.340 3.000 3.670 3.420   T4, Total 4.7 5.0 4.4 Low  4.1 Low  4.7   T3 Uptake Ratio 28 31 31 31 28    Free Thyroxine Index 1.3 1.6 1.4 1.3 1.3   Prostate Specific  Ag, Serum 0.9 0.5 CM 0.6 CM  0.6 CM   Comment: Roche ECLIA methodology. According to the American Urological Association, Serum PSA should decrease and remain at undetectable levels after radical prostatectomy. The AUA defines biochemical recurrence as an initial PSA value 0.2 ng/mL or greater followed by a subsequent confirmatory PSA value 0.2 ng/mL or greater. Values obtained with different assay methods or kits cannot be used interchangeably. Results cannot be interpreted as absolute evidence of the presence or absence of malignant disease.  WBC 3.5 2.7 Low  3.1 Low  3.2 Low  4.5 4.2 R  RBC 5.66 5.33 5.56 5.21 5.54 5.28 R  Hemoglobin 17.7 16.7 16.8 15.4 16.9 16.0 R  Hematocrit 52.6 High  48.4 49.2 44.0 48.8 46.7 R  MCV 93 91 89 85 88 88.3 R  MCH 31.3 31.3 30.2 29.6 30.5 30.4 R  MCHC 33.7 34.5 34.1 35.0 34.6 34.4 R  RDW 12.9 12.6 13.8 12.9 12.9 13.0 R  Platelets 264 239 266 332 255 233 R  Neutrophils 40 42 43 43 47 61 R  Lymphs 45 44 45 46 40   Monocytes 11 11 9 9 10    Eos 3 1 2 1 2    Basos 1 1 1 1 1    Neutrophils Absolute 1.4 1.1 Low  1.3 Low  1.4 2.2 2.6 R  Lymphocytes Absolute 1.6 1.2 1.4 1.5 1.8 1.3 R  Monocytes Absolute 0.4 0.3 0.3 0.3 0.4   EOS (ABSOLUTE) 0.1 0.0 0.1 0.0 0.1   Basophils Absolute 0.0 0.0 0.0 0.0 0.0 0.1 R  Immature Granulocytes 0 1 0 0 0   Immature Grans (Abs) 0.0 0.0 0.0 0.0 0.0                 ____________________________________________  EKG  Sinus rhythm at 65 bpm ____________________________________________    ____________________________________________   INITIAL IMPRESSION / ASSESSMENT AND PLAN   As part of my medical decision making, I reviewed the following data within the electronic MEDICAL RECORD NUMBER       No acute findings on physical exam, EKG, labs.     ____________________________________________   FINAL CLINICAL IMPRESSION  Well exam  ED Discharge Orders     None        Note:  This document was prepared  using Dragon voice recognition software and may include unintentional dictation errors.

## 2024-02-27 NOTE — Progress Notes (Signed)
 Pt presents today to complete FF physical, pt denies any concerns at this time. [name] [name]

## 2024-02-29 ENCOUNTER — Ambulatory Visit: Payer: Self-pay

## 2024-02-29 DIAGNOSIS — H6123 Impacted cerumen, bilateral: Secondary | ICD-10-CM

## 2024-02-29 NOTE — Progress Notes (Signed)
 Pt presents today for ear cleaning. Both ears are cerumen free. Pt tolerated well. Wax free with ear camera pick.

## 2024-06-26 ENCOUNTER — Ambulatory Visit: Payer: Self-pay | Admitting: Physician Assistant

## 2024-06-26 ENCOUNTER — Encounter: Payer: Self-pay | Admitting: Physician Assistant

## 2024-06-26 VITALS — BP 141/91 | HR 99 | Temp 98.4°F | Resp 16

## 2024-06-26 DIAGNOSIS — Z7689 Persons encountering health services in other specified circumstances: Secondary | ICD-10-CM

## 2024-06-26 NOTE — Progress Notes (Signed)
 Presents for return to work note and shows cleared by his PCP Dr. Trudy post broken ribs 05/25/24 reports being able to perform essential functions of job and mild muscle soreness and will do his JPAP physical test for work today.

## 2024-06-26 NOTE — Progress Notes (Signed)
   Subjective: Return to work evaluation    Patient ID: Daton Szilagyi, male    DOB: 1980/09/23, 43 y.o.   MRN: [id number]  HPI Patient is follow-up 1 month status post left nondisplaced rib fracture 1 month ago.  Patient has been followed by Dr. 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  Review of Systems Negative septa for above complaint    Objective:   Physical Exam BP 141/91  Pulse Rate 99  Temp 98.4 F (36.9 C)  Resp 16  SpO2 99 %  HEENT is unremarkable. Neck is supple for lymphadenopathy or bruits. Lungs are clear to auscultation.  Mild guarding with palpation left lateral rib cage. Heart regular rate and rhythm.       Assessment & Plan: Return to work   Patient return to a trial of full duties today.  Follow-up with clinic if condition worsens.
